# Patient Record
Sex: Male | Born: 1935
Health system: Southern US, Community
[De-identification: ages and names within clinical notes are randomized; demographics above are authoritative.]

## PROBLEM LIST (undated history)

## (undated) DIAGNOSIS — I340 Nonrheumatic mitral (valve) insufficiency: Secondary | ICD-10-CM

## (undated) DIAGNOSIS — F1010 Alcohol abuse, uncomplicated: Secondary | ICD-10-CM

## (undated) DIAGNOSIS — D332 Benign neoplasm of brain, unspecified: Secondary | ICD-10-CM

## (undated) DIAGNOSIS — C9110 Chronic lymphocytic leukemia of B-cell type not having achieved remission: Secondary | ICD-10-CM

## (undated) DIAGNOSIS — T50995A Adverse effect of other drugs, medicaments and biological substances, initial encounter: Secondary | ICD-10-CM

## (undated) DIAGNOSIS — Z950 Presence of cardiac pacemaker: Secondary | ICD-10-CM

## (undated) DIAGNOSIS — I5022 Chronic systolic (congestive) heart failure: Secondary | ICD-10-CM

## (undated) DIAGNOSIS — I2699 Other pulmonary embolism without acute cor pulmonale: Secondary | ICD-10-CM

## (undated) DIAGNOSIS — Z86718 Personal history of other venous thrombosis and embolism: Secondary | ICD-10-CM

## (undated) DIAGNOSIS — I4891 Unspecified atrial fibrillation: Secondary | ICD-10-CM

## (undated) DIAGNOSIS — I071 Rheumatic tricuspid insufficiency: Secondary | ICD-10-CM

## (undated) DIAGNOSIS — I4892 Unspecified atrial flutter: Secondary | ICD-10-CM

## (undated) DIAGNOSIS — I10 Essential (primary) hypertension: Secondary | ICD-10-CM

## (undated) HISTORY — DX: Rheumatic tricuspid insufficiency: I07.1

## (undated) HISTORY — DX: Unspecified atrial fibrillation: I48.92

## (undated) HISTORY — DX: Personal history of other venous thrombosis and embolism: Z86.718

## (undated) HISTORY — DX: Adverse effect of other drugs, medicaments and biological substances, initial encounter: T50.995A

## (undated) HISTORY — DX: Alcohol abuse, uncomplicated: F10.10

## (undated) HISTORY — DX: Unspecified atrial fibrillation: I48.91

## (undated) HISTORY — DX: Chronic systolic (congestive) heart failure: I50.22

## (undated) HISTORY — PX: APPENDECTOMY: SHX54

## (undated) HISTORY — PX: OTHER SURGICAL HISTORY: SHX169

## (undated) HISTORY — PX: HERNIA REPAIR: SHX51

## (undated) HISTORY — DX: Nonrheumatic mitral (valve) insufficiency: I34.0

---

## 2015-07-11 ENCOUNTER — Inpatient Hospital Stay (HOSPITAL_COMMUNITY)
Admission: EM | Admit: 2015-07-11 | Discharge: 2015-07-18 | DRG: 175 | Disposition: A | Discharge status: Home / Self Care | Payer: Medicare Other | Attending: Internal Medicine | Admitting: Internal Medicine

## 2015-07-11 ENCOUNTER — Emergency Department (HOSPITAL_COMMUNITY): Payer: Medicare Other

## 2015-07-11 ENCOUNTER — Encounter (HOSPITAL_COMMUNITY): Payer: Self-pay | Admitting: Emergency Medicine

## 2015-07-11 DIAGNOSIS — R55 Syncope and collapse: Secondary | ICD-10-CM | POA: Diagnosis present

## 2015-07-11 DIAGNOSIS — Z9849 Cataract extraction status, unspecified eye: Secondary | ICD-10-CM

## 2015-07-11 DIAGNOSIS — Z950 Presence of cardiac pacemaker: Secondary | ICD-10-CM

## 2015-07-11 DIAGNOSIS — I2699 Other pulmonary embolism without acute cor pulmonale: Secondary | ICD-10-CM | POA: Diagnosis not present

## 2015-07-11 DIAGNOSIS — Z6823 Body mass index (BMI) 23.0-23.9, adult: Secondary | ICD-10-CM

## 2015-07-11 DIAGNOSIS — R9431 Abnormal electrocardiogram [ECG] [EKG]: Secondary | ICD-10-CM

## 2015-07-11 DIAGNOSIS — R609 Edema, unspecified: Secondary | ICD-10-CM

## 2015-07-11 DIAGNOSIS — Z79899 Other long term (current) drug therapy: Secondary | ICD-10-CM

## 2015-07-11 DIAGNOSIS — I82413 Acute embolism and thrombosis of femoral vein, bilateral: Secondary | ICD-10-CM | POA: Diagnosis present

## 2015-07-11 DIAGNOSIS — Z86711 Personal history of pulmonary embolism: Secondary | ICD-10-CM

## 2015-07-11 DIAGNOSIS — Z0389 Encounter for observation for other suspected diseases and conditions ruled out: Secondary | ICD-10-CM

## 2015-07-11 DIAGNOSIS — M2500 Hemarthrosis, unspecified joint: Secondary | ICD-10-CM

## 2015-07-11 DIAGNOSIS — Z7982 Long term (current) use of aspirin: Secondary | ICD-10-CM

## 2015-07-11 DIAGNOSIS — Z7901 Long term (current) use of anticoagulants: Secondary | ICD-10-CM | POA: Diagnosis not present

## 2015-07-11 DIAGNOSIS — E440 Moderate protein-calorie malnutrition: Secondary | ICD-10-CM | POA: Diagnosis present

## 2015-07-11 DIAGNOSIS — I471 Supraventricular tachycardia: Secondary | ICD-10-CM

## 2015-07-11 DIAGNOSIS — Z888 Allergy status to other drugs, medicaments and biological substances status: Secondary | ICD-10-CM

## 2015-07-11 DIAGNOSIS — I5021 Acute systolic (congestive) heart failure: Secondary | ICD-10-CM | POA: Diagnosis present

## 2015-07-11 DIAGNOSIS — IMO0001 Reserved for inherently not codable concepts without codable children: Secondary | ICD-10-CM

## 2015-07-11 DIAGNOSIS — I481 Persistent atrial fibrillation: Secondary | ICD-10-CM | POA: Diagnosis present

## 2015-07-11 DIAGNOSIS — I429 Cardiomyopathy, unspecified: Secondary | ICD-10-CM | POA: Diagnosis present

## 2015-07-11 DIAGNOSIS — I4892 Unspecified atrial flutter: Secondary | ICD-10-CM | POA: Diagnosis present

## 2015-07-11 DIAGNOSIS — Z91041 Radiographic dye allergy status: Secondary | ICD-10-CM

## 2015-07-11 DIAGNOSIS — R0600 Dyspnea, unspecified: Secondary | ICD-10-CM | POA: Diagnosis not present

## 2015-07-11 DIAGNOSIS — M1711 Unilateral primary osteoarthritis, right knee: Secondary | ICD-10-CM | POA: Diagnosis present

## 2015-07-11 DIAGNOSIS — I951 Orthostatic hypotension: Secondary | ICD-10-CM | POA: Diagnosis present

## 2015-07-11 DIAGNOSIS — M25461 Effusion, right knee: Secondary | ICD-10-CM

## 2015-07-11 DIAGNOSIS — R42 Dizziness and giddiness: Secondary | ICD-10-CM | POA: Diagnosis present

## 2015-07-11 DIAGNOSIS — R51 Headache: Secondary | ICD-10-CM

## 2015-07-11 DIAGNOSIS — D329 Benign neoplasm of meninges, unspecified: Secondary | ICD-10-CM | POA: Diagnosis present

## 2015-07-11 DIAGNOSIS — C9110 Chronic lymphocytic leukemia of B-cell type not having achieved remission: Secondary | ICD-10-CM | POA: Diagnosis not present

## 2015-07-11 DIAGNOSIS — I5020 Unspecified systolic (congestive) heart failure: Secondary | ICD-10-CM | POA: Diagnosis present

## 2015-07-11 DIAGNOSIS — I10 Essential (primary) hypertension: Secondary | ICD-10-CM | POA: Diagnosis present

## 2015-07-11 DIAGNOSIS — R519 Headache, unspecified: Secondary | ICD-10-CM

## 2015-07-11 DIAGNOSIS — M109 Gout, unspecified: Secondary | ICD-10-CM | POA: Diagnosis present

## 2015-07-11 HISTORY — DX: Other pulmonary embolism without acute cor pulmonale: I26.99

## 2015-07-11 HISTORY — DX: Presence of cardiac pacemaker: Z95.0

## 2015-07-11 HISTORY — DX: Chronic lymphocytic leukemia of B-cell type not having achieved remission: C91.10

## 2015-07-11 HISTORY — DX: Essential (primary) hypertension: I10

## 2015-07-11 HISTORY — DX: Benign neoplasm of brain, unspecified: D33.2

## 2015-07-11 LAB — BASIC METABOLIC PANEL
ANION GAP: 12 (ref 5–15)
BUN: 13 mg/dL (ref 6–20)
CALCIUM: 10.3 mg/dL (ref 8.9–10.3)
CO2: 27 mmol/L (ref 22–32)
CREATININE: 0.87 mg/dL (ref 0.61–1.24)
Chloride: 101 mmol/L (ref 101–111)
GLUCOSE: 115 mg/dL — AB (ref 65–99)
Potassium: 3.6 mmol/L (ref 3.5–5.1)
Sodium: 140 mmol/L (ref 135–145)

## 2015-07-11 LAB — CBC WITH DIFFERENTIAL/PLATELET
BASOS ABS: 0 10*3/uL (ref 0.0–0.1)
BASOS PCT: 1 %
EOS ABS: 0.2 10*3/uL (ref 0.0–0.7)
EOS PCT: 2 %
HCT: 43.1 % (ref 39.0–52.0)
Hemoglobin: 14.1 g/dL (ref 13.0–17.0)
LYMPHS ABS: 4.6 10*3/uL — AB (ref 0.7–4.0)
Lymphocytes Relative: 55 %
MCH: 29.7 pg (ref 26.0–34.0)
MCHC: 32.7 g/dL (ref 30.0–36.0)
MCV: 90.9 fL (ref 78.0–100.0)
Monocytes Absolute: 0.5 10*3/uL (ref 0.1–1.0)
Monocytes Relative: 6 %
Neutro Abs: 3.1 10*3/uL (ref 1.7–7.7)
Neutrophils Relative %: 36 %
PLATELETS: 178 10*3/uL (ref 150–400)
RBC: 4.74 MIL/uL (ref 4.22–5.81)
RDW: 16.3 % — ABNORMAL HIGH (ref 11.5–15.5)
WBC: 8.4 10*3/uL (ref 4.0–10.5)

## 2015-07-11 LAB — I-STAT CHEM 8, ED
BUN: 19 mg/dL (ref 6–20)
CALCIUM ION: 1.13 mmol/L (ref 1.13–1.30)
CHLORIDE: 104 mmol/L (ref 101–111)
Creatinine, Ser: 0.8 mg/dL (ref 0.61–1.24)
GLUCOSE: 92 mg/dL (ref 65–99)
HCT: 48 % (ref 39.0–52.0)
Hemoglobin: 16.3 g/dL (ref 13.0–17.0)
Potassium: 5 mmol/L (ref 3.5–5.1)
Sodium: 138 mmol/L (ref 135–145)
TCO2: 24 mmol/L (ref 0–100)

## 2015-07-11 LAB — I-STAT TROPONIN, ED: Troponin i, poc: 0.01 ng/mL (ref 0.00–0.08)

## 2015-07-11 LAB — I-STAT CG4 LACTIC ACID, ED: LACTIC ACID, VENOUS: 2.12 mmol/L — AB (ref 0.5–2.0)

## 2015-07-11 LAB — CBG MONITORING, ED: GLUCOSE-CAPILLARY: 81 mg/dL (ref 65–99)

## 2015-07-11 LAB — TROPONIN I

## 2015-07-11 MED ORDER — PANTOPRAZOLE SODIUM 40 MG PO TBEC
40.0000 mg | DELAYED_RELEASE_TABLET | Freq: Every day | ORAL | Status: DC
  Administered 2015-07-11 – 2015-07-17 (×7): 40 mg via ORAL
  Filled 2015-07-11 (×7): qty 1

## 2015-07-11 MED ORDER — LORAZEPAM 2 MG/ML IJ SOLN: 1.0000 mg | Freq: Four times a day (QID) | INTRAMUSCULAR | Status: AC | PRN

## 2015-07-11 MED ORDER — THIAMINE HCL 100 MG/ML IJ SOLN
100.0000 mg | Freq: Every day | INTRAMUSCULAR | Status: DC
  Filled 2015-07-11: qty 2

## 2015-07-11 MED ORDER — VITAMIN B-1 100 MG PO TABS
100.0000 mg | ORAL_TABLET | Freq: Every day | ORAL | Status: DC
  Administered 2015-07-11 – 2015-07-18 (×8): 100 mg via ORAL
  Filled 2015-07-11 (×8): qty 1

## 2015-07-11 MED ORDER — APIXABAN 5 MG PO TABS
5.0000 mg | ORAL_TABLET | Freq: Two times a day (BID) | ORAL | Status: DC
  Administered 2015-07-11 – 2015-07-12 (×2): 5 mg via ORAL
  Filled 2015-07-11 (×2): qty 1

## 2015-07-11 MED ORDER — FOLIC ACID 1 MG PO TABS
1.0000 mg | ORAL_TABLET | Freq: Every day | ORAL | Status: DC
  Administered 2015-07-11 – 2015-07-18 (×8): 1 mg via ORAL
  Filled 2015-07-11 (×8): qty 1

## 2015-07-11 MED ORDER — OMEPRAZOLE MAGNESIUM 20 MG PO TBEC: 20.0000 mg | DELAYED_RELEASE_TABLET | Freq: Every day | ORAL | Status: DC

## 2015-07-11 MED ORDER — ASPIRIN EC 81 MG PO TBEC
81.0000 mg | DELAYED_RELEASE_TABLET | Freq: Every day | ORAL | Status: DC
  Administered 2015-07-11 – 2015-07-18 (×8): 81 mg via ORAL
  Filled 2015-07-11 (×8): qty 1

## 2015-07-11 MED ORDER — SODIUM CHLORIDE 0.9% FLUSH
3.0000 mL | Freq: Two times a day (BID) | INTRAVENOUS | Status: DC
  Administered 2015-07-11 – 2015-07-18 (×10): 3 mL via INTRAVENOUS

## 2015-07-11 MED ORDER — SODIUM CHLORIDE 0.9 % IV SOLN
INTRAVENOUS | Status: AC
  Administered 2015-07-11: 21:00:00 via INTRAVENOUS

## 2015-07-11 MED ORDER — SODIUM CHLORIDE 0.9 % IV BOLUS (SEPSIS)
500.0000 mL | Freq: Once | INTRAVENOUS | Status: AC
  Administered 2015-07-11: 500 mL via INTRAVENOUS

## 2015-07-11 MED ORDER — METOPROLOL TARTRATE 12.5 MG HALF TABLET
12.5000 mg | ORAL_TABLET | Freq: Two times a day (BID) | ORAL | Status: DC
  Administered 2015-07-11 – 2015-07-15 (×7): 12.5 mg via ORAL
  Filled 2015-07-11 (×8): qty 1

## 2015-07-11 MED ORDER — LORAZEPAM 1 MG PO TABS: 1.0000 mg | ORAL_TABLET | Freq: Four times a day (QID) | ORAL | Status: AC | PRN

## 2015-07-11 MED ORDER — GUAIFENESIN ER 600 MG PO TB12
600.0000 mg | ORAL_TABLET | Freq: Two times a day (BID) | ORAL | Status: DC
  Administered 2015-07-11 – 2015-07-18 (×14): 600 mg via ORAL
  Filled 2015-07-11 (×14): qty 1

## 2015-07-11 NOTE — H&P (Signed)
PCP: Pcp Not In System Lives in [place] ([place]  Referring provider Reather Converse   Chief Complaint:  syncope  HPI: Randall Lawrence is a 82 y.o. male   has a past medical history of History of blood clots; Pulmonary embolism ([place]); Brain tumor (benign) ([place]); Leukemia, chronic lymphocytic ([place]); Hypertension; and Pacemaker.   Presented with syncope. He woke up this morning and was feeling well made lunch for everyone but have not ate himself and started to feel lightheaded and collapsed. He was unresponsive for 3-4 min.  He has not been eating and drinking well for some time. He has been getting dizzy and light headed lately. He has been checking his blood pressure and ha e hd some low numbers lately. In AM before this happened his BP was 108/86.  Denies any chest pains he has dyspnea on exertion and have had for a while. Per notes on arrival of EMS patient was hypotensive blood sugar was 124 he felt lightheaded when sitting up IN ER: Glucose 115 lactic acid 2.1 to initial blood pressure 90/51 with administration of IV fluids improved 214/81 patient currently feels much better. Has been afebrile. CT scan of the head showing 2 cm meningioma she is known to the patient but otherwise nonacute. Chest x-ray no acute findings. EKG appeared to be abnormal with frequent PVC but no evidence of acute ischemia.   Regarding pertinent past history: Hx of bilateral DVT and PE diagnosed in November and has been on Eliquis since then, He has hx of Meningioma that has been stable. He has pacemaker unsure why. He has CLL but it has been stable for 20 years not any treatment. Patient has a cardiologist in [place] denies history of heart attacks or coronary artery disease.    Hospitalist was called for admission for Syncope  Review of Systems:    Pertinent positives include: fatigue, dyspnea on exertion, excess mucus, productive cough,  Constitutional:  No  weight loss, night sweats, Fevers, chills,  weight loss  HEENT:  No headaches, Difficulty swallowing,Tooth/dental problems,Sore throat,  No sneezing, itching, ear ache, nasal congestion, post nasal drip,  Cardio-vascular:  No chest pain, Orthopnea, PND, anasarca, dizziness, palpitations.no Bilateral lower extremity swelling  GI:  No heartburn, indigestion, abdominal pain, nausea, vomiting, diarrhea, change in bowel habits, loss of appetite, melena, blood in stool, hematemesis Resp:  no shortness of breath at rest.  No non-productive cough, No coughing up of blood.No change in color of mucus.No wheezing. Skin:  no rash or lesions. No jaundice GU:  no dysuria, change in color of urine, no urgency or frequency. No straining to urinate.  No flank pain.  Musculoskeletal:  No joint pain or no joint swelling. No decreased range of motion. No back pain.  Psych:  No change in mood or affect. No depression or anxiety. No memory loss.  Neuro: no localizing neurological complaints, no tingling, no weakness, no double vision, no gait abnormality, no slurred speech, no confusion  Otherwise ROS are negative except for above, 10 systems were reviewed  Past Medical History: Past Medical History  Diagnosis Date  . History of blood clots   . Pulmonary embolism ([place])   . Brain tumor (benign) ([place])   . Leukemia, chronic lymphocytic ([place])   . Hypertension   . Pacemaker    Past Surgical History  Procedure Laterality Date  . Cataracts    . Appendectomy    . Hernia repair  Medications: Prior to Admission medications   Medication Sig Start Date End Date Taking? Authorizing Provider  acetaminophen (TYLENOL) 500 MG tablet Take 1,000 mg by mouth every 6 (six) hours as needed (pain).   Yes Historical Provider, MD  apixaban (ELIQUIS) 5 MG TABS tablet Take 5 mg by mouth 2 (two) times daily.   Yes Historical Provider, MD  aspirin EC 81 MG tablet Take 81 mg by mouth daily.   Yes Historical Provider,  MD  furosemide (LASIX) 40 MG tablet Take 40 mg by mouth daily as needed (ankle swelling).  05/04/15  Yes Historical Provider, MD  metoprolol tartrate (LOPRESSOR) 25 MG tablet Take 12.5 mg by mouth 2 (two) times daily.   Yes Historical Provider, MD  omeprazole (PRILOSEC OTC) 20 MG tablet Take 20 mg by mouth daily.   Yes Historical Provider, MD  polyvinyl alcohol (LIQUIFILM TEARS) 1.4 % ophthalmic solution Place 1 drop into both eyes 2 (two) times daily as needed for dry eyes.   Yes Historical Provider, MD  Vitamin D, Ergocalciferol, (DRISDOL) 50000 units CAPS capsule Take 50,000 Units by mouth every 7 (seven) days.   Yes Historical Provider, MD    Allergies:   Allergies  Allergen Reactions  . Iodine Hives    Reaction to topical povidine iodine  . Ivp Dye [Iodinated Diagnostic Agents] Hives    Pt states [facility] in [facility]  . Lisinopril Hives  . Losartan Hives  . Zocor [Simvastatin] Hives    Social History:  Ambulatory   [place]  Lives at home With family     reports that he has never smoked. He does not have any smokeless tobacco history on file. He reports that he drinks alcohol. He reports that he does not use illicit drugs.     Family History: family history includes CAD in his mother; Peripheral vascular disease in his mother.    Physical Exam: Patient Vitals for the past 24 hrs:  BP Pulse Resp SpO2  07/11/15 1715 116/65 mmHg 81 24 98 %  07/11/15 1700 108/69 mmHg 98 16 100 %  07/11/15 1645 101/61 mmHg 81 18 99 %  07/11/15 1630 (!) 98/51 mmHg 83 18 97 %  07/11/15 1615 105/67 mmHg - 18 -  07/11/15 1600 (!) 102/51 mmHg 85 16 100 %  07/11/15 1545 113/81 mmHg 79 20 98 %  07/11/15 1530 116/76 mmHg - 18 -  07/11/15 1515 105/61 mmHg - 18 -  07/11/15 1502 114/77 mmHg 99 22 100 %    1. General:  in No Acute distress 2. Psychological: Alert and   Oriented 3. Head/ENT:     Dry Mucous Membranes                          Head Non traumatic, neck supple                           Normal  Dentition 4. SKIN:  decreased Skin turgor,  Skin clean Dry and intact no rash 5. Heart: Regular rate and rhythm mild systolic Murmur, Rub or gallop 6. Lungs:   no wheezes or crackles   7. Abdomen: Soft, non-tender, Non distended 8. Lower extremities: no clubbing, cyanosis, or edema 9. Neurologically Grossly intact, moving all 4 extremities equally 10. MSK: Normal range of motion  body mass index is unknown because there is no height or weight on file.   Labs on  Admission:   Results for orders placed or performed during the hospital encounter of 07/11/15 (from the past 24 hour(s))  CBG monitoring, ED     Status: None   Collection Time: 07/11/15  3:09 PM  Result Value Ref Range   Glucose-Capillary 81 65 - 99 mg/dL   Comment 1 Notify RN    Comment 2 Document in Chart   Basic metabolic panel     Status: Abnormal   Collection Time: 07/11/15  3:50 PM  Result Value Ref Range   Sodium 140 135 - 145 mmol/L   Potassium 3.6 3.5 - 5.1 mmol/L   Chloride 101 101 - 111 mmol/L   CO2 27 22 - 32 mmol/L   Glucose, Bld 115 (H) 65 - 99 mg/dL   BUN 13 6 - 20 mg/dL   Creatinine, Ser 0.87 0.61 - 1.24 mg/dL   Calcium 10.3 8.9 - 10.3 mg/dL   GFR calc non Af Amer >60 >60 mL/min   GFR calc Af Amer >60 >60 mL/min   Anion gap 12 5 - 15  CBC with Differential/Platelet     Status: Abnormal   Collection Time: 07/11/15  3:50 PM  Result Value Ref Range   WBC 8.4 4.0 - 10.5 K/uL   RBC 4.74 4.22 - 5.81 MIL/uL   Hemoglobin 14.1 13.0 - 17.0 g/dL   HCT 43.1 39.0 - 52.0 %   MCV 90.9 78.0 - 100.0 fL   MCH 29.7 26.0 - 34.0 pg   MCHC 32.7 30.0 - 36.0 g/dL   RDW 16.3 (H) 11.5 - 15.5 %   Platelets 178 150 - 400 K/uL   Neutrophils Relative % 36 %   Neutro Abs 3.1 1.7 - 7.7 K/uL   Lymphocytes Relative 55 %   Lymphs Abs 4.6 (H) 0.7 - 4.0 K/uL   Monocytes Relative 6 %   Monocytes Absolute 0.5 0.1 - 1.0 K/uL   Eosinophils Relative 2 %   Eosinophils Absolute 0.2 0.0 - 0.7 K/uL    Basophils Relative 1 %   Basophils Absolute 0.0 0.0 - 0.1 K/uL  I-stat troponin, ED     Status: None   Collection Time: 07/11/15  4:06 PM  Result Value Ref Range   Troponin i, poc 0.01 0.00 - 0.08 ng/mL   Comment 3          I-Stat CG4 Lactic Acid, ED     Status: Abnormal   Collection Time: 07/11/15  4:11 PM  Result Value Ref Range   Lactic Acid, Venous 2.12 (HH) 0.5 - 2.0 mmol/L   Comment NOTIFIED PHYSICIAN   I-Stat Chem 8, ED     Status: None   Collection Time: 07/11/15  4:31 PM  Result Value Ref Range   Sodium 138 135 - 145 mmol/L   Potassium 5.0 3.5 - 5.1 mmol/L   Chloride 104 101 - 111 mmol/L   BUN 19 6 - 20 mg/dL   Creatinine, Ser 0.80 0.61 - 1.24 mg/dL   Glucose, Bld 92 65 - 99 mg/dL   Calcium, Ion 1.13 1.13 - 1.30 mmol/L   TCO2 24 0 - 100 mmol/L   Hemoglobin 16.3 13.0 - 17.0 g/dL   HCT 48.0 39.0 - 52.0 %    UA ordered  No results found for: HGBA1C  CrCl cannot be calculated (Unknown ideal weight.).  BNP (last 3 results) No results for input(s): PROBNP in the last 8760 hours.  Other results:  I have pearsonaly reviewed this: ECG REPORT  Rate: 83  Rhythm: atrial tachycardia, frequent PVC, on tele appeares paced now.  ST&T Change: no evidence of acute ischemia [id number]  There were no vitals filed for this visit.   Cultures: No results found for: SDES, SPECREQUEST, CULT, REPTSTATUS   Radiological Exams on Admission: Ct Head Wo Contrast  07/11/2015  CLINICAL DATA:  Shortness breath and headache. Dizziness and syncope today. No trauma. EXAM: CT HEAD WITHOUT CONTRAST TECHNIQUE: Contiguous axial images were obtained from the base of the skull through the vertex without intravenous contrast. COMPARISON:  None. FINDINGS: Moderate periventricular and diffuse subcortical white matter hypoattenuation is present bilaterally. The basal ganglia are intact. The insular ribbon is normal bilaterally. No focal cortical defects are present. Atherosclerotic calcifications are  present in the cavernous internal carotid arteries bilaterally is well is the dural margin of the vertebral arteries. 82 cm hyperdense dural-based mass is associated with the superior sagittal sinus on the left. This is most compatible with a meningioma. No other focal mass lesions are present. Ventricles are proportionate to the degree of atrophy. No significant extra-axial fluid collection is present. IMPRESSION: 1. No acute intracranial abnormality. 2. Moderate periventricular and subcortical white matter disease bilaterally. This likely reflects the sequela of chronic microvascular ischemia. 3. 2 cm hyperdense extra-axial dural-based mass lesion near the vertex compatible with a meningioma. Electronically Signed   By: San Morelle M.D.   On: 07/11/2015 18:05   Dg Chest Portable 1 View  07/11/2015  CLINICAL DATA:  Dizziness with headaches and severely shortness of breath off and on for several weeks. EXAM: PORTABLE CHEST 1 VIEW COMPARISON:  None. FINDINGS: 1556 hours. Lordotic patient positioning. The lungs are clear wiithout focal pneumonia, edema, pneumothorax or pleural effusion. Cardiopericardial silhouette is at upper limits of normal for size. The visualized bony structures of the thorax are intact. Left delete permanent pacemaker noted. Telemetry leads overlie the chest. IMPRESSION: No active disease. Electronically Signed   By: Misty Stanley M.D.   On: 07/11/2015 16:12    Chart has been reviewed  Family   at  Bedside  plan of care was discussed with  Wife Lilbern Slacum [phone]  Assessment/Plan  82 yo M with known history of CLL, status post pacemaker, history of bilateral DVT and PE in November on chronic anticoagulation presents with syncopal event in a setting of poor by mouth intake  Present on Admission:  . Syncope and collapse  most likely secondary to orthostatic hypotension and poor by mouth intake will admit to telemetry, cycle cardiac enzymes, obtain echogram,   carotid Dopplers, will need to obtain records from cardiology office regarding past history  . Hypertension - continue metoprolol with holding parameters  . CLL (chronic lymphocytic leukemia) (HCC) stable chronic white blood cell, per to be normal  . Dyspnea this was ever since history of PE. Patient. States sometimes he has skipped his anticoagulation but has been taken it regularly lately. Has not have any lower extremity swelling currently given syncope will obtain VQ scan patient has allergies to his CT contrast.     Prophylaxis: eliquis  CODE STATUS:  FULL CODE  as per patient    Disposition:   To home once workup is complete and patient is stable  Other plan as per orders.  I have spent a total of 57 min on this admission   Trinaty Bundrick 07/11/2015, 7:31 PM    [facility]  Pager [phone]   after 2 AM please page floor coverage PA If 7AM-7PM, please contact the day team  taking care of the patient  [email or web address]  Password [email or web address]

## 2015-07-11 NOTE — ED Provider Notes (Signed)
CSN: [id number]     Arrival date & time 07/11/15  1456 History   First MD Initiated Contact with Patient 07/11/15 1459     Chief Complaint  Patient presents with  . Loss of Consciousness  . Shortness of Breath     (Consider location/radiation/quality/duration/timing/severity/associated sxs/prior Treatment) HPI Comments: 82 yo male with pacemaker, htn, PE on eliquis, dvt bilateral LE, brain tumor no treatment, CML presents with syncope, ha and sob.  Happened PTA.  Pt felt generalized HA/ dizziness then syncope for 3-4 minutes, unresponsive.  No CPR.  Pt has passed out in the past. Pt visiting from [facility].  No bleeding recently however hx of GI bleeding.  No CVA hx.  Non smoker.  No heart failure hx.  SOB since PE.    Patient is a 82 y.o. male presenting with syncope and shortness of breath. The history is provided by the patient and a relative.  Loss of Consciousness Associated symptoms: dizziness and shortness of breath   Associated symptoms: no chest pain, no fever and no vomiting   Shortness of Breath Associated symptoms: syncope   Associated symptoms: no chest pain, no cough, no fever and no vomiting     Past Medical History  Diagnosis Date  . History of blood clots   . Pulmonary embolism ([place])   . Brain tumor (benign) ([place])   . Leukemia, chronic lymphocytic ([place])   . Hypertension   . Pacemaker    Past Surgical History  Procedure Laterality Date  . Cataracts    . Appendectomy    . Hernia repair     No family history on file. Social History  Substance Use Topics  . Smoking status: Never Smoker   . Smokeless tobacco: None  . Alcohol Use: Yes     Comment: 1 pint bourbon daily (plus )     Review of Systems  Constitutional: Positive for fatigue. Negative for fever and chills.  HENT: Negative for congestion.   Eyes: Negative for visual disturbance.  Respiratory: Positive for shortness of breath. Negative for cough.   Cardiovascular: Positive for leg swelling and syncope.  Negative for chest pain.  Gastrointestinal: Negative for vomiting.  Genitourinary: Negative for dysuria.  Musculoskeletal: Negative for gait problem.  Skin: Negative for wound.  Neurological: Positive for dizziness and light-headedness.      Allergies  Iodine; Ivp dye; Lisinopril; Losartan; and Zocor  Home Medications   Prior to Admission medications   Medication Sig Start Date End Date Taking? Authorizing Provider  acetaminophen (TYLENOL) 500 MG tablet Take 1,000 mg by mouth every 6 (six) hours as needed (pain).   Yes Historical Provider, MD  apixaban (ELIQUIS) 5 MG TABS tablet Take 5 mg by mouth 2 (two) times daily.   Yes Historical Provider, MD  aspirin EC 81 MG tablet Take 81 mg by mouth daily.   Yes Historical Provider, MD  furosemide (LASIX) 40 MG tablet Take 40 mg by mouth daily as needed (ankle swelling).  05/04/15  Yes Historical Provider, MD  metoprolol tartrate (LOPRESSOR) 25 MG tablet Take 12.5 mg by mouth 2 (two) times daily.   Yes Historical Provider, MD  omeprazole (PRILOSEC OTC) 20 MG tablet Take 20 mg by mouth daily.   Yes Historical Provider, MD  polyvinyl alcohol (LIQUIFILM TEARS) 1.4 % ophthalmic solution Place 1 drop into both eyes 2 (two) times daily as needed for dry eyes.   Yes Historical Provider, MD  Vitamin D, Ergocalciferol, (DRISDOL) 50000 units CAPS capsule Take 50,000 Units by  mouth every 7 (seven) days.   Yes Historical Provider, MD   BP 116/65 mmHg  Pulse 81  Resp 24  SpO2 98% Physical Exam  Constitutional: He is oriented to person, place, and time. He appears well-developed.  HENT:  Head: Normocephalic and atraumatic.  Dry mm  Eyes: Right eye exhibits no discharge. Left eye exhibits no discharge.  Neck: Normal range of motion. Neck supple. No tracheal deviation present.  Cardiovascular: Regular rhythm.  Tachycardia present.   Pulmonary/Chest: Effort normal and breath sounds normal.  Abdominal: Soft. He exhibits no distension. There is no  tenderness. There is no guarding.  Musculoskeletal: He exhibits edema (mild bilateral LE). He exhibits no tenderness.  Neurological: He is alert and oriented to person, place, and time.  General weakness, no arm or leg drift, finger nose, EOMFI.  Sensation intact to palpation bilateral.    Skin: Skin is warm. No rash noted.  Psychiatric: He has a normal mood and affect.  Nursing note and vitals reviewed.   ED Course  Procedures (including critical care time) Labs Review Labs Reviewed  BASIC METABOLIC PANEL - Abnormal; Notable for the following:    Glucose, Bld 115 (*)    All other components within normal limits  CBC WITH DIFFERENTIAL/PLATELET - Abnormal; Notable for the following:    RDW 16.3 (*)    Lymphs Abs 4.6 (*)    All other components within normal limits  I-STAT CG4 LACTIC ACID, ED - Abnormal; Notable for the following:    Lactic Acid, Venous 2.12 (*)    All other components within normal limits  URINALYSIS, ROUTINE W REFLEX MICROSCOPIC (NOT AT [facility])  CBG MONITORING, ED  I-STAT TROPOININ, ED  I-STAT CHEM 8, ED    Imaging Review Ct Head Wo Contrast  07/11/2015  CLINICAL DATA:  Shortness breath and headache. Dizziness and syncope today. No trauma. EXAM: CT HEAD WITHOUT CONTRAST TECHNIQUE: Contiguous axial images were obtained from the base of the skull through the vertex without intravenous contrast. COMPARISON:  None. FINDINGS: Moderate periventricular and diffuse subcortical white matter hypoattenuation is present bilaterally. The basal ganglia are intact. The insular ribbon is normal bilaterally. No focal cortical defects are present. Atherosclerotic calcifications are present in the cavernous internal carotid arteries bilaterally is well is the dural margin of the vertebral arteries. 82 cm hyperdense dural-based mass is associated with the superior sagittal sinus on the left. This is most compatible with a meningioma. No other focal mass lesions are present. Ventricles are  proportionate to the degree of atrophy. No significant extra-axial fluid collection is present. IMPRESSION: 1. No acute intracranial abnormality. 2. Moderate periventricular and subcortical white matter disease bilaterally. This likely reflects the sequela of chronic microvascular ischemia. 3. 2 cm hyperdense extra-axial dural-based mass lesion near the vertex compatible with a meningioma. Electronically Signed   By: San Morelle M.D.   On: 07/11/2015 18:05   Dg Chest Portable 1 View  07/11/2015  CLINICAL DATA:  Dizziness with headaches and severely shortness of breath off and on for several weeks. EXAM: PORTABLE CHEST 1 VIEW COMPARISON:  None. FINDINGS: 1556 hours. Lordotic patient positioning. The lungs are clear wiithout focal pneumonia, edema, pneumothorax or pleural effusion. Cardiopericardial silhouette is at upper limits of normal for size. The visualized bony structures of the thorax are intact. Left delete permanent pacemaker noted. Telemetry leads overlie the chest. IMPRESSION: No active disease. Electronically Signed   By: Misty Stanley M.D.   On: 07/11/2015 16:12   I have personally reviewed  and evaluated these images and lab results as part of my medical decision-making.   EKG Interpretation   Date/Time:  Sunday July 11 2015 14:58:02 EST Ventricular Rate:  83 PR Interval:  272 QRS Duration: 142 QT Interval:  407 QTC Calculation: 478 R Axis:   -82 Text Interpretation:  Sinus or ectopic atrial tachycardia Ventricular  trigeminy Prolonged PR interval Nonspecific IVCD with LAD Left ventricular  hypertrophy Anterior infarct, old Lateral leads are also involved Probable  RV involvement, suggest recording right precordial leads Baseline wander  in lead(s) V6 No old Confirmed by Zanaria Morell  MD, Millena Callins ([id number]) on 07/11/2015  3:11:29 PM      MDM   Final diagnoses:  Syncope and collapse  Dyspnea  EKG abnormalities   Elderly patient presents with syncope, broad differential  including CNS bleed, PE, dehydration, arrhythmia, bleeding.  Pt has history support for PE, bleeding and dehydration.  Plan for blood work, cardiac screen, CT angio if renal function okay.  EKG abnormal however no olds.  Pacemaker being interrogated. Plan for tele observation.   CT head to look for bleeding, fluids.   CT head negative. Vital signs stable. Unable to obtain CT angina the chest due to allergy. Plan for VQ scan inpatient and telemetry observation. Paged hospitalist. The patients results and plan were reviewed and discussed.   Any x-rays performed were independently reviewed by myself.   Differential diagnosis were considered with the presenting HPI.  Medications  0.9 %  sodium chloride infusion (not administered)  sodium chloride 0.9 % bolus 500 mL (500 mLs Intravenous New Bag/Given 07/11/15 1630)    Filed Vitals:   07/11/15 1630 07/11/15 1645 07/11/15 1700 07/11/15 1715  BP: 98/51 101/61 108/69 116/65  Pulse: 83 81 98 81  Resp: 18 18 16 24   SpO2: 97% 99% 100% 98%    Final diagnoses:  Syncope and collapse  Dyspnea  EKG abnormalities    Admission/ observation were discussed with the admitting physician, patient and/or family and they are comfortable with the plan.     Elnora Morrison, MD 07/11/15 [phone]

## 2015-07-11 NOTE — ED Notes (Addendum)
Report given to Ray, RN.  The floor pt is going to has an emergent patient issue.  Ray will call when ready for pt.

## 2015-07-11 NOTE — ED Notes (Signed)
Patient transported to CT 

## 2015-07-11 NOTE — ED Notes (Addendum)
ATTEMPTED REPORT  

## 2015-07-11 NOTE — ED Notes (Signed)
Pt eating dinner

## 2015-07-11 NOTE — ED Notes (Signed)
Dr. Reather Converse notified that interrogation was already completed and they are sending the report.

## 2015-07-11 NOTE — ED Notes (Addendum)
Syncopal episode while eating, loss of consciousness, daughter helped him to the floor-- on EMS arrival-- pt was hypotensive. On arrival to ED pt is 114/77. Alert/oriented x4. CBG at house was 124 . Pt has a headache, feels dizzy when sitting up.

## 2015-07-12 ENCOUNTER — Observation Stay (HOSPITAL_BASED_OUTPATIENT_CLINIC_OR_DEPARTMENT_OTHER): Payer: Medicare Other

## 2015-07-12 ENCOUNTER — Observation Stay (HOSPITAL_COMMUNITY): Payer: Medicare Other

## 2015-07-12 DIAGNOSIS — R55 Syncope and collapse: Secondary | ICD-10-CM

## 2015-07-12 DIAGNOSIS — C9110 Chronic lymphocytic leukemia of B-cell type not having achieved remission: Secondary | ICD-10-CM | POA: Diagnosis not present

## 2015-07-12 DIAGNOSIS — Z7901 Long term (current) use of anticoagulants: Secondary | ICD-10-CM | POA: Diagnosis not present

## 2015-07-12 DIAGNOSIS — I2699 Other pulmonary embolism without acute cor pulmonale: Secondary | ICD-10-CM | POA: Diagnosis not present

## 2015-07-12 DIAGNOSIS — E440 Moderate protein-calorie malnutrition: Secondary | ICD-10-CM | POA: Diagnosis present

## 2015-07-12 LAB — COMPREHENSIVE METABOLIC PANEL
ALK PHOS: 88 U/L (ref 38–126)
AST: 16 U/L (ref 15–41)
Albumin: 3.5 g/dL (ref 3.5–5.0)
Anion gap: 8 (ref 5–15)
BILIRUBIN TOTAL: 0.6 mg/dL (ref 0.3–1.2)
BUN: 11 mg/dL (ref 6–20)
CALCIUM: 9.6 mg/dL (ref 8.9–10.3)
CHLORIDE: 107 mmol/L (ref 101–111)
CO2: 29 mmol/L (ref 22–32)
CREATININE: 0.88 mg/dL (ref 0.61–1.24)
Glucose, Bld: 119 mg/dL — ABNORMAL HIGH (ref 65–99)
Potassium: 3.7 mmol/L (ref 3.5–5.1)
Sodium: 144 mmol/L (ref 135–145)
TOTAL PROTEIN: 6.1 g/dL — AB (ref 6.5–8.1)

## 2015-07-12 LAB — CBC WITH DIFFERENTIAL/PLATELET
BASOS PCT: 0 %
Basophils Absolute: 0 10*3/uL (ref 0.0–0.1)
EOS ABS: 0.2 10*3/uL (ref 0.0–0.7)
EOS PCT: 2 %
HCT: 41.5 % (ref 39.0–52.0)
Hemoglobin: 13.6 g/dL (ref 13.0–17.0)
LYMPHS ABS: 5.6 10*3/uL — AB (ref 0.7–4.0)
Lymphocytes Relative: 62 %
MCH: 30 pg (ref 26.0–34.0)
MCHC: 32.8 g/dL (ref 30.0–36.0)
MCV: 91.4 fL (ref 78.0–100.0)
Monocytes Absolute: 0.6 10*3/uL (ref 0.1–1.0)
Monocytes Relative: 6 %
NEUTROS PCT: 30 %
Neutro Abs: 2.7 10*3/uL (ref 1.7–7.7)
PLATELETS: 180 10*3/uL (ref 150–400)
RBC: 4.54 MIL/uL (ref 4.22–5.81)
RDW: 16.6 % — ABNORMAL HIGH (ref 11.5–15.5)
WBC: 9.1 10*3/uL (ref 4.0–10.5)

## 2015-07-12 LAB — APTT: aPTT: 32 seconds (ref 24–37)

## 2015-07-12 LAB — TROPONIN I: Troponin I: <0.03 ng/mL (ref <0.031)

## 2015-07-12 LAB — HEPARIN LEVEL (UNFRACTIONATED): Heparin Unfractionated: >2.2 IU/mL — ABNORMAL HIGH (ref 0.30–0.70)

## 2015-07-12 LAB — MAGNESIUM: MAGNESIUM: 1.8 mg/dL (ref 1.7–2.4)

## 2015-07-12 LAB — PHOSPHORUS: Phosphorus: 3.4 mg/dL (ref 2.5–4.6)

## 2015-07-12 MED ORDER — TECHNETIUM TC 99M DIETHYLENETRIAME-PENTAACETIC ACID: 31.1000 | Freq: Once | INTRAVENOUS | Status: DC | PRN

## 2015-07-12 MED ORDER — TECHNETIUM TO 99M ALBUMIN AGGREGATED
4.2000 | Freq: Once | INTRAVENOUS | Status: AC | PRN
  Administered 2015-07-12: 4 via INTRAVENOUS

## 2015-07-12 MED ORDER — BOOST PLUS PO LIQD
237.0000 mL | Freq: Three times a day (TID) | ORAL | Status: DC
  Administered 2015-07-12 – 2015-07-18 (×16): 237 mL via ORAL
  Filled 2015-07-12 (×23): qty 237

## 2015-07-12 MED ORDER — HEPARIN (PORCINE) IN NACL 100-0.45 UNIT/ML-% IJ SOLN
1100.0000 [IU]/h | INTRAMUSCULAR | Status: DC
  Administered 2015-07-12: 1100 [IU]/h via INTRAVENOUS
  Filled 2015-07-12: qty 250

## 2015-07-12 MED ORDER — ACETAMINOPHEN 325 MG PO TABS
650.0000 mg | ORAL_TABLET | ORAL | Status: DC | PRN
  Administered 2015-07-12 – 2015-07-15 (×4): 650 mg via ORAL
  Filled 2015-07-12 (×4): qty 2

## 2015-07-12 NOTE — Progress Notes (Signed)
  Echocardiogram 2D Echocardiogram has been performed.  Jennette Dubin 07/12/2015, 9:43 AM

## 2015-07-12 NOTE — Progress Notes (Signed)
ANTICOAGULATION CONSULT NOTE - Initial Consult  Pharmacy Consult for heparin Indication: pulmonary embolus  Allergies  Allergen Reactions  . Iodine Hives    Reaction to topical povidine iodine  . Ivp Dye [Iodinated Diagnostic Agents] Hives    Pt states that [facility] done pre-treatment in the past  . Lisinopril Hives  . Losartan Hives  . Zocor [Simvastatin] Hives    Patient Measurements: Height: 5\' 9"  (175.3 cm) Weight: 153 lb 3.2 oz (69.491 kg) (a scale) IBW/kg (Calculated) : 70.7 Heparin Dosing Weight: 69.5 kg  Vital Signs: Temp: 97.9 F (36.6 C) (01/30 0744) Temp Source: Oral (01/30 0744) BP: 133/98 mmHg (01/30 0957) Pulse Rate: 84 (01/30 0957)  Labs:  Recent Labs  07/11/15 1550 07/11/15 1631 07/11/15 2123 07/12/15 0327 07/12/15 1159  HGB 14.1 16.3  --  13.6  --   HCT 43.1 48.0  --  41.5  --   PLT 178  --   --  180  --   CREATININE 0.87 0.80  --  0.88  --   TROPONINI  --   --  <0.03 <0.03 <0.03    Estimated Creatinine Clearance: 57 mL/min (by C-G formula based on Cr of 0.88).   Medical History: Past Medical History  Diagnosis Date  . History of blood clots   . Pulmonary embolism ([place])   . Brain tumor (benign) ([place])   . Leukemia, chronic lymphocytic ([place])   . Hypertension   . Pacemaker    Assessment: 80 YOM with hx of PE, on eliquis 5mg  BID PTA, who presented after a syncopal episode yesterday, VQ scan is high probability for PE in RUL and RLL. Pharmacy is consulted to switch eliquis to IV heparin. eliqusi last dose was this AM at ~ 1000. Hgb 13.6, plt 180k, est. crcl ~ 55-60 ml/min. Head CT - negative for bleeding.  Goal of Therapy:  APTT = 66-102 Heparin level 0.3-0.7 units/ml Monitor platelets by anticoagulation protocol: Yes   Plan:  - Start heparin infusion 1100 units/hr at 2200 with no bolus - Baseline anti-Xa leval and aPTT level STAT - f/u aPTT at 0600 - daily aPTT/anti-Xa and CBC - f/u plans for oral anticoagulation.   Maryanna Shape,  PharmD, BCPS  Clinical Pharmacist  Pager: [phone]   07/12/2015,3:44 PM

## 2015-07-12 NOTE — Care Management Obs Status (Signed)
[place] NOTIFICATION   Patient Details  Name: Randall Lawrence MRN: [id number] Date of Birth: March 19, 1925   Medicare Observation Status Notification Given:   yes    Royston Bake, RN 07/12/2015, 3:36 PM

## 2015-07-12 NOTE — Progress Notes (Signed)
[facility] Progress Note   Randall Lawrence  [id number]  DOB: Jun 16, 1924  DOA: 07/11/2015 PCP: Pcp Not In System  Brief narrative: Randall Lawrence is a 82 y.o. male with h/o PE, HTN CLL is in [place] visiting family and presents after a syncopal episode yesterday. He admits to dyspnea and dizziness at home. EMS found him to be hypotensive with BP of 90/51.    Subjective: Short of breath on exertion and at times at rest. No cough.   Assessment/Plan: Principal Problem:   Syncope and collapse/  Acute pulmonary embolism - no c/o dyspnea and not hypoxic but  VQ is high probability for PE in RUL and RLL - will switch Apixaban to Heparin - possible hypercoaguable state due to CLL - f/u on ECHO, troponin negative  - heat CT reveals a meningioma and chronic microvascular disease  Active Problems:   Hypertension - cont metoprolol     CLL (chronic lymphocytic leukemia)     Malnutrition of moderate degree - will start supplements    Antibiotics: Anti-infectives    None     Code Status:     Code Status Orders        Start     Ordered   07/11/15 2054  Full code   Continuous     07/11/15 2053    Code Status History    Date Active Date Inactive Code Status Order ID Comments User Context   This patient has a current code status but no historical code status.     Family Communication:  Disposition Plan: home in 1-2 days DVT prophylaxis: [place] Consultants: hematology Procedures: VQ, ECHO    Objective: Filed Weights   07/11/15 2106 07/11/15 2158 07/12/15 0502  Weight: 69.355 kg (152 lb 14.4 oz) 69.446 kg (153 lb 1.6 oz) 69.491 kg (153 lb 3.2 oz)    Intake/Output Summary (Last 24 hours) at 07/12/15 1544 Last data filed at 07/12/15 0845  Gross per 24 hour  Intake    790 ml  Output    625 ml  Net    165 ml     Vitals Filed Vitals:   07/11/15 2158 07/12/15 0502 07/12/15 0744 07/12/15 0957  BP:  148/71 132/69 133/98  Pulse:  80 82 84  Temp:  98 F  (36.7 C) 97.9 F (36.6 C)   TempSrc:  Oral Oral   Resp:  18 18   Height:      Weight: 69.446 kg (153 lb 1.6 oz) 69.491 kg (153 lb 3.2 oz)    SpO2:  100% 100%     Exam:  General:  Pt is alert, not in acute distress  HEENT: No icterus, No thrush, oral mucosa moist  Cardiovascular: regular rate and rhythm, S1/S2 No murmur  Respiratory: clear to auscultation bilaterally   Abdomen: Soft, +Bowel sounds, non tender, non distended, no guarding  MSK: No cyanosis or clubbing- no pedal edema   Data Reviewed: Basic Metabolic Panel:  Recent Labs Lab 07/11/15 1550 07/11/15 1631 07/12/15 0327  NA 140 138 144  K 3.6 5.0 3.7  CL 101 104 107  CO2 27  --  29  GLUCOSE 115* 92 119*  BUN 13 19 11   CREATININE 0.87 0.80 0.88  CALCIUM 10.3  --  9.6  MG  --   --  1.8  PHOS  --   --  3.4   Liver Function Tests:  Recent Labs Lab 07/12/15 0327  AST 16  ALT <5*  ALKPHOS 88  BILITOT 0.6  PROT 6.1*  ALBUMIN 3.5   No results for input(s): LIPASE, AMYLASE in the last 168 hours. No results for input(s): AMMONIA in the last 168 hours. CBC:  Recent Labs Lab 07/11/15 1550 07/11/15 1631 07/12/15 0327  WBC 8.4  --  9.1  NEUTROABS 3.1  --  2.7  HGB 14.1 16.3 13.6  HCT 43.1 48.0 41.5  MCV 90.9  --  91.4  PLT 178  --  180   Cardiac Enzymes:  Recent Labs Lab 07/11/15 2123 07/12/15 0327 07/12/15 1159  TROPONINI <0.03 <0.03 <0.03   BNP (last 3 results) No results for input(s): BNP in the last 8760 hours.  ProBNP (last 3 results) No results for input(s): PROBNP in the last 8760 hours.  CBG:  Recent Labs Lab 07/11/15 1509  GLUCAP 81    No results found for this or any previous visit (from the past 240 hour(s)).   Studies: Ct Head Wo Contrast  07/11/2015  CLINICAL DATA:  Shortness breath and headache. Dizziness and syncope today. No trauma. EXAM: CT HEAD WITHOUT CONTRAST TECHNIQUE: Contiguous axial images were obtained from the base of the skull through the vertex  without intravenous contrast. COMPARISON:  None. FINDINGS: Moderate periventricular and diffuse subcortical white matter hypoattenuation is present bilaterally. The basal ganglia are intact. The insular ribbon is normal bilaterally. No focal cortical defects are present. Atherosclerotic calcifications are present in the cavernous internal carotid arteries bilaterally is well is the dural margin of the vertebral arteries. 82 cm hyperdense dural-based mass is associated with the superior sagittal sinus on the left. This is most compatible with a meningioma. No other focal mass lesions are present. Ventricles are proportionate to the degree of atrophy. No significant extra-axial fluid collection is present. IMPRESSION: 1. No acute intracranial abnormality. 2. Moderate periventricular and subcortical white matter disease bilaterally. This likely reflects the sequela of chronic microvascular ischemia. 3. 2 cm hyperdense extra-axial dural-based mass lesion near the vertex compatible with a meningioma. Electronically Signed   By: San Morelle M.D.   On: 07/11/2015 18:05   Nm Pulmonary Perf And Vent  07/12/2015  CLINICAL DATA:  Syncope and collapse, shortness of breath, history brain tumor, pulmonary embolism, chronic lymphocytic leukemia, hypertension EXAM: NUCLEAR MEDICINE VENTILATION - PERFUSION LUNG SCAN TECHNIQUE: Ventilation images were obtained in multiple projections using inhaled aerosol Tc-92m DTPA. Perfusion images were obtained in multiple projections after intravenous injection of Tc-56m MAA. RADIOPHARMACEUTICALS:  [id number] millicuries [id number] DTPA aerosol inhalation and 4.2 millicuries [id number] MAA IV COMPARISON:  None Radiographic correlation:  Chest radiograph 07/11/2015 FINDINGS: Ventilation: Central airway deposition of tracer. No focal ventilatory defects. Photopenic defect from pacemaker generator LEFT chest. Perfusion: Absent perfusion in RIGHT upper lobe. Subsegmental perfusion  defect RIGHT lower lobe, potentially an additional small subsegmental perfusion defect in RIGHT lower lobe. These areas ventilate normally. Normal perfusion to LEFT lung. Chest radiograph: Enlargement of cardiac silhouette post pacemaker. No gross infiltrates. IMPRESSION: Absent perfusion RIGHT upper lobe with additional subsegmental perfusion defect RIGHT lower lobe with normal ventilation in these areas. Findings represent a high probability for pulmonary embolism. Findings called to Janett Billow RN on [place] on 07/12/2015 at 1431 hours. Electronically Signed   By: Lavonia Dana M.D.   On: 07/12/2015 14:31   Dg Chest Portable 1 View  07/11/2015  CLINICAL DATA:  Dizziness with headaches and severely shortness of breath off and on for several weeks. EXAM: PORTABLE CHEST 1 VIEW COMPARISON:  None. FINDINGS: 1556 hours. Lordotic patient positioning. The  lungs are clear wiithout focal pneumonia, edema, pneumothorax or pleural effusion. Cardiopericardial silhouette is at upper limits of normal for size. The visualized bony structures of the thorax are intact. Left delete permanent pacemaker noted. Telemetry leads overlie the chest. IMPRESSION: No active disease. Electronically Signed   By: Misty Stanley M.D.   On: 07/11/2015 16:12    Scheduled Meds:  Scheduled Meds: . aspirin EC  81 mg Oral Daily  . folic acid  1 mg Oral Daily  . guaiFENesin  600 mg Oral BID  . lactose free nutrition  237 mL Oral TID BM  . metoprolol tartrate  12.5 mg Oral BID  . pantoprazole  40 mg Oral QHS  . sodium chloride flush  3 mL Intravenous Q12H  . thiamine  100 mg Oral Daily   Or  . thiamine  100 mg Intravenous Daily   Continuous Infusions:   Time spent on care of this patient: 20 min   [place], MD 07/12/2015, 3:44 PM    [facility] Office  [phone] Pager - Text Page per [email or web address] If 7PM-7AM, please contact night-coverage [email or web address]

## 2015-07-12 NOTE — Progress Notes (Signed)
VASCULAR LAB PRELIMINARY  PRELIMINARY  PRELIMINARY  PRELIMINARY  Carotid Duplex has been completed.  Left ICA indicates 1-39% stenosis.  Vertebral artery flow is antegrade bilaterally.   Right ICA no evidence of stenosis or plaque.   Breaunna Gottlieb, RVT, RDMS 07/12/2015, 9:30 AM

## 2015-07-12 NOTE — Evaluation (Signed)
Clinical/Bedside Swallow Evaluation Patient Details  Name: Randall Lawrence MRN: [id number] Date of Birth: 02-22-1925  Today's Date: 07/12/2015 Time: SLP Start Time (ACUTE ONLY): 0830 SLP Stop Time (ACUTE ONLY): 0852 SLP Time Calculation (min) (ACUTE ONLY): 22 min  Past Medical History:  Past Medical History  Diagnosis Date  . History of blood clots   . Pulmonary embolism ([place])   . Brain tumor (benign) ([place])   . Leukemia, chronic lymphocytic ([place])   . Hypertension   . Pacemaker    Past Surgical History:  Past Surgical History  Procedure Laterality Date  . Cataracts    . Appendectomy    . Hernia repair     HPI:  Pt is 82 y.o. male with h/o blood clots, pulmonary embolism, benign brain tumor, chronic lymphocytic  leukemia, hypertension, Pacemaker. Pt presented to ED after syncopal episode and poor PO intake. CT Head 1/29 no acute intracranial abnormality. CXR 1/29 no active disease. No found h/o SLP intervention.   Assessment / Plan / Recommendation Clinical Impression    SLP provided skilled observation of pt at bedside during mealtime to assess swallow function. No s/s of penetration/aspiration observed. Pt reported h/o globus sensation, phlegm production, belching during meals, possibly indicative of esophageal dysphagia. Globus sensation and belching were noted throughout assessment. Pt educated re: esophageal precautions, possible esophageal assessment and diet recommendation. Recommend Dysphagia 3 diet due to edentulous status and thin liquids, whole meds in puree. Pt should be seated upright during meals and remain upright at least 30 minutes after meals. No SLP f/u is warranted at this time, as education is complete. MD educated re: esophageal dysphagia symptoms.    Aspiration Risk  Mild aspiration risk    Diet Recommendation Regular;Thin liquid   Liquid Administration via: Cup Medication Administration: Whole meds with puree Supervision: Patient able to self  feed;Intermittent supervision to cue for compensatory strategies Compensations: Minimize environmental distractions;Slow rate;Small sips/bites;Follow solids with liquid Postural Changes: Seated upright at 90 degrees;Remain upright for at least 30 minutes after po intake    Other  Recommendations Recommended Consults: Consider esophageal assessment Oral Care Recommendations: Oral care BID   Follow up Recommendations       Frequency and Duration            Prognosis Prognosis for Safe Diet Advancement: Good      Swallow Study   General HPI: Pt is 82 y.o. male with h/o blood clots, pulmonary embolism, benign brain tumor, chronic lymphocytic  leukemia, hypertension, Pacemaker. Pt presented to ED after syncopal episode and poor PO intake. CT Head 1/29 no acute intracranial abnormality. CXR 1/29 no active disease. No found h/o SLP intervention. Type of Study: Bedside Swallow Evaluation Previous Swallow Assessment: pt reported barium swallow 10 years ago Diet Prior to this Study: Regular;Thin liquids Temperature Spikes Noted: No Respiratory Status: Room air History of Recent Intubation: No Behavior/Cognition: Alert;Cooperative;Pleasant mood Oral Cavity Assessment: Within Functional Limits Oral Care Completed by SLP: No Oral Cavity - Dentition: Edentulous Vision: Functional for self-feeding Self-Feeding Abilities: Able to feed self Patient Positioning: Upright in bed Baseline Vocal Quality: Normal Volitional Cough: Weak Volitional Swallow: Able to elicit    Oral/Motor/Sensory Function Overall Oral Motor/Sensory Function: Within functional limits   Ice Chips Ice chips: Not tested   Thin Liquid Thin Liquid: Impaired Presentation: Cup;Self Fed Other Comments: belching, globus sensation    Nectar Thick Nectar Thick Liquid: Not tested   Honey Thick Honey Thick Liquid: Not tested   Puree Puree: Not tested  Solid   GO   Solid: Impaired Other Comments: belching, globus sensation         Randall Lawrence 07/12/2015,9:52 AM

## 2015-07-12 NOTE — Progress Notes (Signed)
Initial Nutrition Assessment  DOCUMENTATION CODES:   Non-severe (moderate) malnutrition in context of chronic illness  INTERVENTION:  Provide Boost Plus PO TID, each supplement provides 360 kcal and 13 grams of protein Recommend liberalizing diet from Heart Healthy to Regular Encourage PO intake  NUTRITION DIAGNOSIS:   Inadequate oral intake related to poor appetite, dysphagia as evidenced by per patient/family report, energy intake < 75% for > or equal to 1 month, mild depletion of body fat, mild depletion of muscle mass, percent weight loss.   GOAL:   Patient will meet greater than or equal to 90% of their needs   MONITOR:   PO intake, Labs, Weight trends, Skin, I & O's  REASON FOR ASSESSMENT:   Consult Assessment of nutrition requirement/status  ASSESSMENT:   82 y.o. male with h/o blood clots, pulmonary embolism, benign brain tumor, chronic lymphocytic leukemia, hypertension, Pacemaker. Pt presented to ED after syncopal episode and poor PO intake.   Pt states that he has been eating poorly since July due to poor appetite, difficulty swallowing, and due to dentures not fitting well. He used to maintain his weight at 165 lbs, but gained up to 208 lbs when he retired. He reports losing from 208 lbs to 153 lbs in the past 7 months (26% weight loss is severe for time frame). He ate <25% of breakfast this morning (1/2 a mini bagel) which is slightly less than he was eating PTA. He drinks Boost Plus up to twice daily at home. Pt has some mild muscle and fat wasting per nutrition-focused physical exam.   Labs and medications reviewed.   Diet Order:  Diet Heart Room service appropriate?: Yes; Fluid consistency:: Thin  Skin:  Reviewed, no issues  Last BM:  1/29  Height:   Ht Readings from Last 1 Encounters:  07/11/15 5\' 9"  (1.753 m)    Weight:   Wt Readings from Last 1 Encounters:  07/12/15 153 lb 3.2 oz (69.491 kg)    Ideal Body Weight:  72.7 kg  BMI:  Body mass  index is 22.61 kg/(m^2).  Estimated Nutritional Needs:   Kcal:  1600-1800  Protein:  80-90 grams  Fluid:  1.6-1.8 L/day  EDUCATION NEEDS:   No education needs identified at this time  [place], LDN Inpatient Clinical Dietitian Pager: [phone] After Hours Pager: [phone]

## 2015-07-13 DIAGNOSIS — Z7901 Long term (current) use of anticoagulants: Secondary | ICD-10-CM | POA: Diagnosis not present

## 2015-07-13 DIAGNOSIS — C9110 Chronic lymphocytic leukemia of B-cell type not having achieved remission: Secondary | ICD-10-CM | POA: Diagnosis not present

## 2015-07-13 DIAGNOSIS — R55 Syncope and collapse: Secondary | ICD-10-CM | POA: Diagnosis not present

## 2015-07-13 DIAGNOSIS — I2699 Other pulmonary embolism without acute cor pulmonale: Secondary | ICD-10-CM | POA: Diagnosis not present

## 2015-07-13 LAB — APTT
APTT: 120 s — AB (ref 24–37)
aPTT: 152 seconds — ABNORMAL HIGH (ref 24–37)

## 2015-07-13 LAB — URINALYSIS, ROUTINE W REFLEX MICROSCOPIC
BILIRUBIN URINE: NEGATIVE
Glucose, UA: NEGATIVE mg/dL
Hgb urine dipstick: NEGATIVE
Ketones, ur: NEGATIVE mg/dL
NITRITE: NEGATIVE
PROTEIN: NEGATIVE mg/dL
SPECIFIC GRAVITY, URINE: 1.016 (ref 1.005–1.030)
pH: 5.5 (ref 5.0–8.0)

## 2015-07-13 LAB — URINE MICROSCOPIC-ADD ON

## 2015-07-13 LAB — GLUCOSE, CAPILLARY: Glucose-Capillary: 89 mg/dL (ref 65–99)

## 2015-07-13 LAB — HEMOGLOBIN A1C
HEMOGLOBIN A1C: 5.6 % (ref 4.8–5.6)
MEAN PLASMA GLUCOSE: 114 mg/dL

## 2015-07-13 MED ORDER — FUROSEMIDE 10 MG/ML IJ SOLN
40.0000 mg | Freq: Every day | INTRAMUSCULAR | Status: DC
  Administered 2015-07-13 – 2015-07-15 (×3): 40 mg via INTRAVENOUS
  Filled 2015-07-13 (×3): qty 4

## 2015-07-13 MED ORDER — ISOSORBIDE MONONITRATE ER 30 MG PO TB24
30.0000 mg | ORAL_TABLET | Freq: Every day | ORAL | Status: DC
  Administered 2015-07-13 – 2015-07-15 (×3): 30 mg via ORAL
  Filled 2015-07-13 (×3): qty 1

## 2015-07-13 MED ORDER — HEPARIN (PORCINE) IN NACL 100-0.45 UNIT/ML-% IJ SOLN
900.0000 [IU]/h | INTRAMUSCULAR | Status: AC
  Administered 2015-07-13 (×2): 800 [IU]/h via INTRAVENOUS
  Administered 2015-07-15: 900 [IU]/h via INTRAVENOUS
  Filled 2015-07-13 (×3): qty 250

## 2015-07-13 MED ORDER — POLYVINYL ALCOHOL 1.4 % OP SOLN
1.0000 [drp] | Freq: Two times a day (BID) | OPHTHALMIC | Status: DC | PRN
  Administered 2015-07-14 – 2015-07-15 (×2): 1 [drp] via OPHTHALMIC
  Filled 2015-07-13: qty 15

## 2015-07-13 MED ORDER — HYDRALAZINE HCL 10 MG PO TABS
10.0000 mg | ORAL_TABLET | Freq: Three times a day (TID) | ORAL | Status: DC
  Administered 2015-07-13 – 2015-07-15 (×6): 10 mg via ORAL
  Filled 2015-07-13 (×7): qty 1

## 2015-07-13 NOTE — Progress Notes (Signed)
ANTICOAGULATION CONSULT NOTE - Follow Up Consult  Pharmacy Consult for heparin Indication: pulmonary embolus   Labs:  Recent Labs  07/11/15 1550 07/11/15 1631 07/11/15 2123 07/12/15 0327 07/12/15 1159 07/12/15 1703 07/13/15 0531  HGB 14.1 16.3  --  13.6  --   --   --   HCT 43.1 48.0  --  41.5  --   --   --   PLT 178  --   --  180  --   --   --   APTT  --   --   --   --   --  32 152*  HEPARINUNFRC  --   --   --   --   --  >2.20*  --   CREATININE 0.87 0.80  --  0.88  --   --   --   TROPONINI  --   --  <0.03 <0.03 <0.03  --   --     Assessment: 82yo male supratherapeutic on heparin after started for new PE while on Eliquis; lab collected correctly in opposite arm.  Goal of Therapy:  aPTT 66-102 seconds   Plan:  Will hold heparin gtt x1hr then resume at lower rate of 900 units/hr and check PTT in [place], PharmD, BCPS  07/13/2015,7:31 AM

## 2015-07-13 NOTE — Progress Notes (Signed)
[facility] Progress Note   Randall Lawrence  [id number]  DOB: 1924-07-12  DOA: 07/11/2015 PCP: Pcp Not In System  Brief narrative: Randall Lawrence is a 82 y.o. male with h/o PE, HTN CLL is in [place] visiting family and presents after a syncopal episode yesterday. He admits to dyspnea and dizziness at home. EMS found him to be hypotensive with BP of 90/51.    Subjective: Short of breath on exertion and at times at rest. No cough.   Assessment/Plan: Principal Problem:   Syncope and collapse/  Acute pulmonary embolism - no c/o dyspnea and not hypoxic but  VQ is high probability for PE in RUL and RLL - h/o DVTS in past as well- no need to obtain Venous duplex as it will not change management - have switched Apixaban to Heparin - possible hypercoaguable state due to CLL -  ECHO does not reveal a thrombus- troponin negative  - heat CT reveals a meningioma and chronic microvascular disease - check ambulatory pulse ox today to determine need for home O2  Active Problems:  Systolic CHF - EF [id number] %- new finding - will start Hydralazine/ Imdur- allergic to ACE and ARB - legs quite swollen- will start Lasix daily and follow    Hypertension - cont metoprolol     CLL (chronic lymphocytic leukemia)     Malnutrition of moderate degree - will start supplements    Antibiotics: Anti-infectives    None     Code Status:     Code Status Orders        Start     Ordered   07/11/15 2054  Full code   Continuous     07/11/15 2053    Code Status History    Date Active Date Inactive Code Status Order ID Comments User Context   This patient has a current code status but no historical code status.     Family Communication:  Disposition Plan: home in 1-2 days DVT prophylaxis: [place] Consultants: hematology Procedures: VQ, ECHO    Objective: Filed Weights   07/11/15 2158 07/12/15 0502 07/13/15 0404  Weight: 69.446 kg (153 lb 1.6 oz) 69.491 kg (153 lb 3.2 oz) 69.809  kg (153 lb 14.4 oz)    Intake/Output Summary (Last 24 hours) at 07/13/15 1309 Last data filed at 07/13/15 1222  Gross per 24 hour  Intake 651.33 ml  Output    975 ml  Net -323.67 ml     Vitals Filed Vitals:   07/12/15 2036 07/13/15 0404 07/13/15 0743 07/13/15 1100  BP: 126/84 140/77 129/88 121/93  Pulse: 81 76 82 85  Temp: 98.5 F (36.9 C) 98 F (36.7 C)    TempSrc: Oral Oral    Resp: 16 17 18 16   Height:      Weight:  69.809 kg (153 lb 14.4 oz)    SpO2: 100% 99% 98% 100%    Exam:  General:  Pt is alert, not in acute distress  HEENT: No icterus, No thrush, oral mucosa moist  Cardiovascular: regular rate and rhythm, S1/S2 No murmur  Respiratory: clear to auscultation bilaterally   Abdomen: Soft, +Bowel sounds, non tender, non distended, no guarding  MSK: No cyanosis or clubbing- 2+ pedal edema   Data Reviewed: Basic Metabolic Panel:  Recent Labs Lab 07/11/15 1550 07/11/15 1631 07/12/15 0327  NA 140 138 144  K 3.6 5.0 3.7  CL 101 104 107  CO2 27  --  29  GLUCOSE 115* 92 119*  BUN  13 19 11   CREATININE 0.87 0.80 0.88  CALCIUM 10.3  --  9.6  MG  --   --  1.8  PHOS  --   --  3.4   Liver Function Tests:  Recent Labs Lab 07/12/15 0327  AST 16  ALT <5*  ALKPHOS 88  BILITOT 0.6  PROT 6.1*  ALBUMIN 3.5   No results for input(s): LIPASE, AMYLASE in the last 168 hours. No results for input(s): AMMONIA in the last 168 hours. CBC:  Recent Labs Lab 07/11/15 1550 07/11/15 1631 07/12/15 0327  WBC 8.4  --  9.1  NEUTROABS 3.1  --  2.7  HGB 14.1 16.3 13.6  HCT 43.1 48.0 41.5  MCV 90.9  --  91.4  PLT 178  --  180   Cardiac Enzymes:  Recent Labs Lab 07/11/15 2123 07/12/15 0327 07/12/15 1159  TROPONINI <0.03 <0.03 <0.03   BNP (last 3 results) No results for input(s): BNP in the last 8760 hours.  ProBNP (last 3 results) No results for input(s): PROBNP in the last 8760 hours.  CBG:  Recent Labs Lab 07/11/15 1509 07/13/15 0605  GLUCAP  81 89    No results found for this or any previous visit (from the past 240 hour(s)).   Studies: Ct Head Wo Contrast  07/11/2015  CLINICAL DATA:  Shortness breath and headache. Dizziness and syncope today. No trauma. EXAM: CT HEAD WITHOUT CONTRAST TECHNIQUE: Contiguous axial images were obtained from the base of the skull through the vertex without intravenous contrast. COMPARISON:  None. FINDINGS: Moderate periventricular and diffuse subcortical white matter hypoattenuation is present bilaterally. The basal ganglia are intact. The insular ribbon is normal bilaterally. No focal cortical defects are present. Atherosclerotic calcifications are present in the cavernous internal carotid arteries bilaterally is well is the dural margin of the vertebral arteries. 82 cm hyperdense dural-based mass is associated with the superior sagittal sinus on the left. This is most compatible with a meningioma. No other focal mass lesions are present. Ventricles are proportionate to the degree of atrophy. No significant extra-axial fluid collection is present. IMPRESSION: 1. No acute intracranial abnormality. 2. Moderate periventricular and subcortical white matter disease bilaterally. This likely reflects the sequela of chronic microvascular ischemia. 3. 2 cm hyperdense extra-axial dural-based mass lesion near the vertex compatible with a meningioma. Electronically Signed   By: San Morelle M.D.   On: 07/11/2015 18:05   Nm Pulmonary Perf And Vent  07/12/2015  CLINICAL DATA:  Syncope and collapse, shortness of breath, history brain tumor, pulmonary embolism, chronic lymphocytic leukemia, hypertension EXAM: NUCLEAR MEDICINE VENTILATION - PERFUSION LUNG SCAN TECHNIQUE: Ventilation images were obtained in multiple projections using inhaled aerosol Tc-65m DTPA. Perfusion images were obtained in multiple projections after intravenous injection of Tc-56m MAA. RADIOPHARMACEUTICALS:  99991111 millicuries [id number] DTPA  aerosol inhalation and 4.2 millicuries [id number] MAA IV COMPARISON:  None Radiographic correlation:  Chest radiograph 07/11/2015 FINDINGS: Ventilation: Central airway deposition of tracer. No focal ventilatory defects. Photopenic defect from pacemaker generator LEFT chest. Perfusion: Absent perfusion in RIGHT upper lobe. Subsegmental perfusion defect RIGHT lower lobe, potentially an additional small subsegmental perfusion defect in RIGHT lower lobe. These areas ventilate normally. Normal perfusion to LEFT lung. Chest radiograph: Enlargement of cardiac silhouette post pacemaker. No gross infiltrates. IMPRESSION: Absent perfusion RIGHT upper lobe with additional subsegmental perfusion defect RIGHT lower lobe with normal ventilation in these areas. Findings represent a high probability for pulmonary embolism. Findings called to Janett Billow RN on [place] on 07/12/2015 at 1431  hours. Electronically Signed   By: Lavonia Dana M.D.   On: 07/12/2015 14:31   Dg Chest Portable 1 View  07/11/2015  CLINICAL DATA:  Dizziness with headaches and severely shortness of breath off and on for several weeks. EXAM: PORTABLE CHEST 1 VIEW COMPARISON:  None. FINDINGS: 1556 hours. Lordotic patient positioning. The lungs are clear wiithout focal pneumonia, edema, pneumothorax or pleural effusion. Cardiopericardial silhouette is at upper limits of normal for size. The visualized bony structures of the thorax are intact. Left delete permanent pacemaker noted. Telemetry leads overlie the chest. IMPRESSION: No active disease. Electronically Signed   By: Misty Stanley M.D.   On: 07/11/2015 16:12    Scheduled Meds:  Scheduled Meds: . aspirin EC  81 mg Oral Daily  . folic acid  1 mg Oral Daily  . furosemide  40 mg Intravenous Daily  . guaiFENesin  600 mg Oral BID  . hydrALAZINE  10 mg Oral 3 times per day  . isosorbide mononitrate  30 mg Oral Daily  . lactose free nutrition  237 mL Oral TID BM  . metoprolol tartrate  12.5 mg Oral BID   . pantoprazole  40 mg Oral QHS  . sodium chloride flush  3 mL Intravenous Q12H  . thiamine  100 mg Oral Daily   Continuous Infusions: . heparin 900 Units/hr (07/13/15 0901)    Time spent on care of this patient: 32 min   [place], MD 07/13/2015, 1:09 PM    [facility] Office  [phone] Pager - Text Page per [email or web address] If 7PM-7AM, please contact night-coverage [email or web address]

## 2015-07-13 NOTE — Progress Notes (Signed)
[place] Dr Wynelle Cleveland made awarwe of o2 sat on ambulation .kept O2 @2l /m [place]

## 2015-07-13 NOTE — Progress Notes (Signed)
[place] for heparin Indication: pulmonary embolus  Allergies  Allergen Reactions  . Iodine Hives    Reaction to topical povidine iodine  . Ivp Dye [Iodinated Diagnostic Agents] Hives    [facility] states that [facility]  . Lisinopril Hives  . Losartan Hives  . Zocor [Simvastatin] Hives    Patient Measurements: Height: 5\' 9"  (175.3 cm) Weight: 153 lb 14.4 oz (69.809 kg) (a scale) IBW/kg (Calculated) : 70.7 Heparin Dosing Weight: 69.5 kg  Vital Signs: BP: 121/93 mmHg (01/31 1100) Pulse Rate: 85 (01/31 1100)  Labs:  Recent Labs  07/11/15 1550 07/11/15 1631 07/11/15 2123 07/12/15 0327 07/12/15 1159 07/12/15 1703 07/13/15 0531 07/13/15 1624  HGB 14.1 16.3  --  13.6  --   --   --   --   HCT 43.1 48.0  --  41.5  --   --   --   --   PLT 178  --   --  180  --   --   --   --   APTT  --   --   --   --   --  32 152* 120*  HEPARINUNFRC  --   --   --   --   --  >2.20*  --   --   CREATININE 0.87 0.80  --  0.88  --   --   --   --   TROPONINI  --   --  <0.03 <0.03 <0.03  --   --   --     Estimated Creatinine Clearance: 57.3 mL/min (by C-G formula based on Cr of 0.88).  Assessment: 23 YOM with hx of PE, on Eliquis 5mg  BID PTA, who presented after a syncopal episode yesterday, VQ scan is high probability for PE in RUL and RLL. Pharmacy is consulted to switch eliquis to IV heparin. Last dose of Eliquis was 1/30 at ~10AM.  APTT has been elevated, earlier today was 152, heparin held and resumed at lower rate. Now aPTT 120 which is still above goal. No bleeding noted.  Goal of Therapy:  APTT = 66-102 Heparin level 0.3-0.7 units/ml Monitor platelets by anticoagulation protocol: Yes   Plan:  - Reduce heparin to 800units/hr (~1.5unit/kg/hr decrease) - Recheck aPTT and HL with AM labs - Daily CBC - Follow for s/s bleeding  Arohi Salvatierra D. Tashanna Dolin, PharmD, BCPS Clinical Pharmacist Pager: [phone] 07/13/2015 5:18 PM

## 2015-07-14 ENCOUNTER — Observation Stay (HOSPITAL_COMMUNITY): Payer: Medicare Other

## 2015-07-14 DIAGNOSIS — R609 Edema, unspecified: Secondary | ICD-10-CM | POA: Diagnosis not present

## 2015-07-14 DIAGNOSIS — M10061 Idiopathic gout, right knee: Secondary | ICD-10-CM | POA: Diagnosis not present

## 2015-07-14 DIAGNOSIS — Z86711 Personal history of pulmonary embolism: Secondary | ICD-10-CM | POA: Diagnosis not present

## 2015-07-14 DIAGNOSIS — I5020 Unspecified systolic (congestive) heart failure: Secondary | ICD-10-CM | POA: Diagnosis present

## 2015-07-14 DIAGNOSIS — I10 Essential (primary) hypertension: Secondary | ICD-10-CM | POA: Diagnosis present

## 2015-07-14 DIAGNOSIS — I2609 Other pulmonary embolism with acute cor pulmonale: Secondary | ICD-10-CM

## 2015-07-14 DIAGNOSIS — I82413 Acute embolism and thrombosis of femoral vein, bilateral: Secondary | ICD-10-CM | POA: Diagnosis present

## 2015-07-14 DIAGNOSIS — D329 Benign neoplasm of meninges, unspecified: Secondary | ICD-10-CM

## 2015-07-14 DIAGNOSIS — Z6823 Body mass index (BMI) 23.0-23.9, adult: Secondary | ICD-10-CM | POA: Diagnosis not present

## 2015-07-14 DIAGNOSIS — I2699 Other pulmonary embolism without acute cor pulmonale: Principal | ICD-10-CM

## 2015-07-14 DIAGNOSIS — Z79899 Other long term (current) drug therapy: Secondary | ICD-10-CM | POA: Diagnosis not present

## 2015-07-14 DIAGNOSIS — Z950 Presence of cardiac pacemaker: Secondary | ICD-10-CM | POA: Diagnosis not present

## 2015-07-14 DIAGNOSIS — I4892 Unspecified atrial flutter: Secondary | ICD-10-CM | POA: Diagnosis present

## 2015-07-14 DIAGNOSIS — I481 Persistent atrial fibrillation: Secondary | ICD-10-CM | POA: Diagnosis present

## 2015-07-14 DIAGNOSIS — Z7901 Long term (current) use of anticoagulants: Secondary | ICD-10-CM | POA: Diagnosis not present

## 2015-07-14 DIAGNOSIS — I5021 Acute systolic (congestive) heart failure: Secondary | ICD-10-CM | POA: Diagnosis present

## 2015-07-14 DIAGNOSIS — C9110 Chronic lymphocytic leukemia of B-cell type not having achieved remission: Secondary | ICD-10-CM | POA: Diagnosis present

## 2015-07-14 DIAGNOSIS — R0600 Dyspnea, unspecified: Secondary | ICD-10-CM

## 2015-07-14 DIAGNOSIS — Z9849 Cataract extraction status, unspecified eye: Secondary | ICD-10-CM | POA: Diagnosis not present

## 2015-07-14 DIAGNOSIS — Z91041 Radiographic dye allergy status: Secondary | ICD-10-CM | POA: Diagnosis not present

## 2015-07-14 DIAGNOSIS — I483 Typical atrial flutter: Secondary | ICD-10-CM | POA: Diagnosis not present

## 2015-07-14 DIAGNOSIS — I951 Orthostatic hypotension: Secondary | ICD-10-CM | POA: Diagnosis present

## 2015-07-14 DIAGNOSIS — M109 Gout, unspecified: Secondary | ICD-10-CM | POA: Diagnosis present

## 2015-07-14 DIAGNOSIS — Z888 Allergy status to other drugs, medicaments and biological substances status: Secondary | ICD-10-CM | POA: Diagnosis not present

## 2015-07-14 DIAGNOSIS — R55 Syncope and collapse: Secondary | ICD-10-CM | POA: Diagnosis present

## 2015-07-14 DIAGNOSIS — E440 Moderate protein-calorie malnutrition: Secondary | ICD-10-CM | POA: Diagnosis present

## 2015-07-14 DIAGNOSIS — Z7982 Long term (current) use of aspirin: Secondary | ICD-10-CM | POA: Diagnosis not present

## 2015-07-14 DIAGNOSIS — M1711 Unilateral primary osteoarthritis, right knee: Secondary | ICD-10-CM | POA: Diagnosis present

## 2015-07-14 DIAGNOSIS — I429 Cardiomyopathy, unspecified: Secondary | ICD-10-CM | POA: Diagnosis present

## 2015-07-14 DIAGNOSIS — R42 Dizziness and giddiness: Secondary | ICD-10-CM | POA: Diagnosis present

## 2015-07-14 LAB — GLUCOSE, CAPILLARY: Glucose-Capillary: 86 mg/dL (ref 65–99)

## 2015-07-14 LAB — CBC
HEMATOCRIT: 40 % (ref 39.0–52.0)
Hemoglobin: 12.8 g/dL — ABNORMAL LOW (ref 13.0–17.0)
MCH: 29.4 pg (ref 26.0–34.0)
MCHC: 32 g/dL (ref 30.0–36.0)
MCV: 91.7 fL (ref 78.0–100.0)
Platelets: 196 10*3/uL (ref 150–400)
RBC: 4.36 MIL/uL (ref 4.22–5.81)
RDW: 16.3 % — AB (ref 11.5–15.5)
WBC: 9.2 10*3/uL (ref 4.0–10.5)

## 2015-07-14 LAB — HEPARIN LEVEL (UNFRACTIONATED)
HEPARIN UNFRACTIONATED: 1.1 [IU]/mL — AB (ref 0.30–0.70)
HEPARIN UNFRACTIONATED: 0.88 [IU]/mL — AB (ref 0.30–0.70)

## 2015-07-14 LAB — APTT
aPTT: 127 seconds — ABNORMAL HIGH (ref 24–37)
aPTT: 82 seconds — ABNORMAL HIGH (ref 24–37)
aPTT: 53 seconds — ABNORMAL HIGH (ref 24–37)

## 2015-07-14 MED ORDER — SODIUM CHLORIDE 0.9 % IV BOLUS (SEPSIS)
500.0000 mL | Freq: Once | INTRAVENOUS | Status: AC
  Administered 2015-07-15: 500 mL via INTRAVENOUS

## 2015-07-14 NOTE — Progress Notes (Signed)
ANTICOAGULATION CONSULT NOTE - Follow Up Consult  Pharmacy Consult for Heparin Indication: pulmonary embolus  Allergies  Allergen Reactions  . Iodine Hives    Reaction to topical povidine iodine  . Ivp Dye [Iodinated Diagnostic Agents] Hives    [facility] states that [facility]  . Lisinopril Hives  . Losartan Hives  . Zocor [Simvastatin] Hives    Patient Measurements: Height: 5\' 9"  (175.3 cm) Weight: 155 lb 4.8 oz (70.444 kg) IBW/kg (Calculated) : 70.7 Heparin Dosing Weight: 70 kg  Vital Signs: Temp: 97.5 F (36.4 C) (02/01 1800) Temp Source: Oral (02/01 1800) BP: 101/74 mmHg (02/01 1800) Pulse Rate: 88 (02/01 1800)  Labs:  Recent Labs  07/11/15 2123 07/12/15 0327 07/12/15 1159 07/12/15 1703  07/14/15 0420 07/14/15 0915 07/14/15 1656  HGB  --  13.6  --   --   --  12.8*  --   --   HCT  --  41.5  --   --   --  40.0  --   --   PLT  --  180  --   --   --  196  --   --   APTT  --   --   --  32  < > 127* 82* 53*  HEPARINUNFRC  --   --   --  >2.20*  --  1.10* 0.88*  --   CREATININE  --  0.88  --   --   --   --   --   --   TROPONINI <0.03 <0.03 <0.03  --   --   --   --   --   < > = values in this interval not displayed.  Estimated Creatinine Clearance: 57.8 mL/min (by C-G formula based on Cr of 0.88).  Assessment: 63 YOM with hx of PE November 2016, on Eliquis 5mg  BID prior to admission. Admitted 07/11/15 after syncopal episode. VQ scan is high probability for PE in RUL and RLL. Changed from Eliquis to IV heparin on 1/30 pm. Last Eliquis dose 1/30 am. aPTTs have been high, and heparin rate has been decreased several times. Now aPTT low at 53. No issues documented.   Goal of Therapy:  Heparin level 0.3-0.7 units/ml aPTT 66-102 seconds Monitor platelets by anticoagulation protocol: Yes   Plan:  Increase heparin drip to 750 units/hr Check 8 hr aPTT tomorrow morning Monitor daily aPTT / HL, CBC, s/s of bleed  Will follow up anticoag plans;   Eliquis vs therapeutic Lovenox.  Elenor Quinones, PharmD, BCPS Clinical Pharmacist Pager [phone] 07/14/2015 6:22 PM

## 2015-07-14 NOTE — Progress Notes (Signed)
1830 pt resting in chair with spouse in the room. On heparin @ 7.5 ml/hr via pump as ordered

## 2015-07-14 NOTE — Progress Notes (Signed)
Pt's BP was 92/52 and complains of dizziness. K. Schorr was notified and ordered to give 500 ml NSS bolus once and hold the Metoprolol PO and Hydralazine PO tonight. Will continue to monitor pt

## 2015-07-14 NOTE — Progress Notes (Signed)
[facility]                                                                              Patient Demographics  Randall Lawrence, is a 82 y.o. male, DOB - [birth year], [id number]  Admit date - 07/11/2015   Admitting Physician Toy Baker, MD  Outpatient Primary MD for the patient is Pcp Not In System  LOS - 0   Chief Complaint  Patient presents with  . Loss of Consciousness  . Shortness of Breath       Brief HPI  Randall Lawrence is a 82 y.o. male with h/o PE, HTN CLL is in [place] visiting family and presented after a syncopal episode. He admitted to dyspnea and dizziness at home. EMS found him to be hypotensive with BP of 90/51.     Assessment & Plan   Principal Problem:  Syncope and collapse/ Acute pulmonary embolism -  Currently on 2 L O2 via nasal cannula, reports not on any O2 outpatient.  VQ is high probability for PE in RUL and RLL - h/o bilateral DVTS in past as well- no need to obtain Venous duplex as it will not change management - have switched Apixaban to Heparin - possible hypercoaguable state due to CLL - ECHO does not reveal a thrombus- troponin negative  - head CT reveals a meningioma and chronic microvascular disease - Discussed in detail with hematology, Dr. Irene Limbo, recommended to obtain medical records from [facility] patient's prior imagings of CT angiogram. If patient has pulmonary embolism in the same areas and then he may continue of apixaban. However if new areas of acute PE are involved, patient will need therapeutic Lovenox and follow up outpatient with hematology - I have requested records from patient's [place] cardiology office in [facility]  Active Problems: Systolic CHF - EF 123XX123 %- new finding - will start Hydralazine/ Imdur- allergic to ACE and ARB - legs quite swollen- will start Lasix daily and follow   Hypertension - cont metoprolol    CLL (chronic lymphocytic leukemia)    Malnutrition of  moderate degree - will start supplements  Code Status: fc  Family Communication: Discussed in detail with the patient, all imaging results, lab results explained to the patient   Disposition Plan:   Time Spent in minutes  25 minutes  Procedures  VQ scan  Consults   hematology  DVT Prophylaxis  heparin   Medications  Scheduled Meds: . aspirin EC  81 mg Oral Daily  . folic acid  1 mg Oral Daily  . furosemide  40 mg Intravenous Daily  . guaiFENesin  600 mg Oral BID  . hydrALAZINE  10 mg Oral 3 times per day  . isosorbide mononitrate  30 mg Oral Daily  . lactose free nutrition  237 mL Oral TID BM  . metoprolol tartrate  12.5 mg Oral BID  . pantoprazole  40 mg Oral QHS  . sodium chloride flush  3 mL Intravenous Q12H  . thiamine  100 mg Oral Daily   Continuous Infusions: . heparin 600 Units/hr (07/14/15 0518)   PRN Meds:.acetaminophen, LORazepam **OR** LORazepam, polyvinyl alcohol, technetium TC  65M diethylenetriame-pentaacetic acid   Antibiotics   Anti-infectives    None        Subjective:   [name] was seen and examined today.  Shortness of breath +. No chest pain. Patient denies dizziness, abdominal pain, N/V/D/C, new weakness, numbess, tingling. No acute events overnight.    Objective:   Blood pressure 114/87, pulse 83, temperature 98.6 F (37 C), temperature source Oral, resp. rate 16, height 5\' 9"  (1.753 m), weight 70.444 kg (155 lb 4.8 oz), SpO2 100 %.  Wt Readings from Last 3 Encounters:  07/14/15 70.444 kg (155 lb 4.8 oz)     Intake/Output Summary (Last 24 hours) at 07/14/15 1113 Last data filed at 07/14/15 1052  Gross per 24 hour  Intake 434.72 ml  Output   2015 ml  Net -1580.28 ml    Exam  General: Alert and oriented x 3, NAD  HEENT:  PERRLA, EOMI, Anicteric Sclera, mucous membranes moist.   Neck: Supple, no JVD, no masses  CVS: S1 S2 auscultated, no rubs, murmurs or gallops. Regular rate and rhythm.  Respiratory: Clear to  auscultation bilaterally, no wheezing, rales or rhonchi  Abdomen: Soft, nontender, nondistended, + bowel sounds  Ext: no cyanosis clubbing, 2+ edema  Neuro: AAOx3, Cr N's II- XII. Strength 5/5 upper and lower extremities bilaterally  Skin: No rashes  Psych: Normal affect and demeanor, alert and oriented x3    Data Review   Micro Results No results found for this or any previous visit (from the past 240 hour(s)).  Radiology Reports Ct Head Wo Contrast  07/11/2015  CLINICAL DATA:  Shortness breath and headache. Dizziness and syncope today. No trauma. EXAM: CT HEAD WITHOUT CONTRAST TECHNIQUE: Contiguous axial images were obtained from the base of the skull through the vertex without intravenous contrast. COMPARISON:  None. FINDINGS: Moderate periventricular and diffuse subcortical white matter hypoattenuation is present bilaterally. The basal ganglia are intact. The insular ribbon is normal bilaterally. No focal cortical defects are present. Atherosclerotic calcifications are present in the cavernous internal carotid arteries bilaterally is well is the dural margin of the vertebral arteries. 82 cm hyperdense dural-based mass is associated with the superior sagittal sinus on the left. This is most compatible with a meningioma. No other focal mass lesions are present. Ventricles are proportionate to the degree of atrophy. No significant extra-axial fluid collection is present. IMPRESSION: 1. No acute intracranial abnormality. 2. Moderate periventricular and subcortical white matter disease bilaterally. This likely reflects the sequela of chronic microvascular ischemia. 3. 2 cm hyperdense extra-axial dural-based mass lesion near the vertex compatible with a meningioma. Electronically Signed   By: San Morelle M.D.   On: 07/11/2015 18:05   Nm Pulmonary Perf And Vent  07/12/2015  CLINICAL DATA:  Syncope and collapse, shortness of breath, history brain tumor, pulmonary embolism, chronic  lymphocytic leukemia, hypertension EXAM: NUCLEAR MEDICINE VENTILATION - PERFUSION LUNG SCAN TECHNIQUE: Ventilation images were obtained in multiple projections using inhaled aerosol Tc-16m DTPA. Perfusion images were obtained in multiple projections after intravenous injection of Tc-81m MAA. RADIOPHARMACEUTICALS:  99991111 millicuries [id number] DTPA aerosol inhalation and 4.2 millicuries [id number] MAA IV COMPARISON:  None Radiographic correlation:  Chest radiograph 07/11/2015 FINDINGS: Ventilation: Central airway deposition of tracer. No focal ventilatory defects. Photopenic defect from pacemaker generator LEFT chest. Perfusion: Absent perfusion in RIGHT upper lobe. Subsegmental perfusion defect RIGHT lower lobe, potentially an additional small subsegmental perfusion defect in RIGHT lower lobe. These areas ventilate normally. Normal perfusion to LEFT lung. Chest radiograph: Enlargement of  cardiac silhouette post pacemaker. No gross infiltrates. IMPRESSION: Absent perfusion RIGHT upper lobe with additional subsegmental perfusion defect RIGHT lower lobe with normal ventilation in these areas. Findings represent a high probability for pulmonary embolism. Findings called to Janett Billow RN on [place] on 07/12/2015 at 1431 hours. Electronically Signed   By: Lavonia Dana M.D.   On: 07/12/2015 14:31   Dg Chest Portable 1 View  07/11/2015  CLINICAL DATA:  Dizziness with headaches and severely shortness of breath off and on for several weeks. EXAM: PORTABLE CHEST 1 VIEW COMPARISON:  None. FINDINGS: 1556 hours. Lordotic patient positioning. The lungs are clear wiithout focal pneumonia, edema, pneumothorax or pleural effusion. Cardiopericardial silhouette is at upper limits of normal for size. The visualized bony structures of the thorax are intact. Left delete permanent pacemaker noted. Telemetry leads overlie the chest. IMPRESSION: No active disease. Electronically Signed   By: Misty Stanley M.D.   On: 07/11/2015 16:12     CBC  Recent Labs Lab 07/11/15 1550 07/11/15 1631 07/12/15 0327 07/14/15 0420  WBC 8.4  --  9.1 9.2  HGB 14.1 16.3 13.6 12.8*  HCT 43.1 48.0 41.5 40.0  PLT 178  --  180 196  MCV 90.9  --  91.4 91.7  MCH 29.7  --  30.0 29.4  MCHC 32.7  --  32.8 32.0  RDW 16.3*  --  16.6* 16.3*  LYMPHSABS 4.6*  --  5.6*  --   MONOABS 0.5  --  0.6  --   EOSABS 0.2  --  0.2  --   BASOSABS 0.0  --  0.0  --     Chemistries   Recent Labs Lab 07/11/15 1550 07/11/15 1631 07/12/15 0327  NA 140 138 144  K 3.6 5.0 3.7  CL 101 104 107  CO2 27  --  29  GLUCOSE 115* 92 119*  BUN 13 19 11   CREATININE 0.87 0.80 0.88  CALCIUM 10.3  --  9.6  MG  --   --  1.8  AST  --   --  16  ALT  --   --  <5*  ALKPHOS  --   --  88  BILITOT  --   --  0.6   ------------------------------------------------------------------------------------------------------------------ estimated creatinine clearance is 57.8 mL/min (by C-G formula based on Cr of 0.88). ------------------------------------------------------------------------------------------------------------------  Recent Labs  07/12/15 0327  HGBA1C 5.6   ------------------------------------------------------------------------------------------------------------------ No results for input(s): CHOL, HDL, LDLCALC, TRIG, CHOLHDL, LDLDIRECT in the last 72 hours. ------------------------------------------------------------------------------------------------------------------ No results for input(s): TSH, T4TOTAL, T3FREE, THYROIDAB in the last 72 hours.  Invalid input(s): FREET3 ------------------------------------------------------------------------------------------------------------------ No results for input(s): VITAMINB12, FOLATE, FERRITIN, TIBC, IRON, RETICCTPCT in the last 72 hours.  Coagulation profile No results for input(s): INR, PROTIME in the last 168 hours.  No results for input(s): DDIMER in the last 72 hours.  Cardiac Enzymes  Recent  Labs Lab 07/11/15 2123 07/12/15 0327 07/12/15 1159  TROPONINI <0.03 <0.03 <0.03   ------------------------------------------------------------------------------------------------------------------ Invalid input(s): POCBNP   Recent Labs  07/11/15 1509 07/13/15 0605 07/14/15 0545  GLUCAP 81 42 86     RAI,RIPUDEEP M.D. [facility] 07/14/2015, 11:13 AM  Pager: [phone] Between 7am to 7pm - call Pager - [phone]  After 7pm go to [email or web address] - password [email or web address]  Call night coverage person covering after 7pm

## 2015-07-14 NOTE — Progress Notes (Signed)
[place] for heparin Indication: pulmonary embolus  Allergies  Allergen Reactions  . Iodine Hives    Reaction to topical povidine iodine  . Ivp Dye [Iodinated Diagnostic Agents] Hives    [facility]  . Lisinopril Hives  . Losartan Hives  . Zocor [Simvastatin] Hives    Patient Measurements: Height: 5\' 9"  (175.3 cm) Weight: 153 lb 14.4 oz (69.809 kg) (a scale) IBW/kg (Calculated) : 70.7 Heparin Dosing Weight: 69.5 kg  Vital Signs: Temp: 97.9 F (36.6 C) (01/31 2057) Temp Source: Oral (01/31 2057) BP: 109/73 mmHg (01/31 2057) Pulse Rate: 86 (01/31 2057)  Labs:  Recent Labs  07/11/15 1550 07/11/15 1631 07/11/15 2123 07/12/15 0327 07/12/15 1159  07/12/15 1703 07/13/15 0531 07/13/15 1624 07/14/15 0420  HGB 14.1 16.3  --  13.6  --   --   --   --   --  12.8*  HCT 43.1 48.0  --  41.5  --   --   --   --   --  40.0  PLT 178  --   --  180  --   --   --   --   --  196  APTT  --   --   --   --   --   < > 32 152* 120* 127*  HEPARINUNFRC  --   --   --   --   --   --  >2.20*  --   --  1.10*  CREATININE 0.87 0.80  --  0.88  --   --   --   --   --   --   TROPONINI  --   --  <0.03 <0.03 <0.03  --   --   --   --   --   < > = values in this interval not displayed.  Estimated Creatinine Clearance: 57.3 mL/min (by C-G formula based on Cr of 0.88).  Assessment: 43 YOM with hx of PE, on Eliquis 5mg  BID PTA, who presented after a syncopal episode yesterday, VQ scan is high probability for PE in RUL and RLL. Pharmacy is consulted to switch eliquis to IV heparin. Last dose of Eliquis was 1/30 at ~10AM.  APTT has been elevated, earlier today was 152, heparin held and resumed at lower rate. Now aPTT 127 which is still above goal.  No bleeding noted.  Goal of Therapy:  APTT = 66-102 Heparin level 0.3-0.7 units/ml Monitor platelets by anticoagulation protocol: Yes   Plan:  - Reduce heparin to 600units/hr   - Recheck aPTT and HL in 6 hours  Thank you Anette Guarneri, PharmD [phone] 07/14/2015 5:19 AM

## 2015-07-14 NOTE — Progress Notes (Signed)
VASCULAR LAB PRELIMINARY  PRELIMINARY  PRELIMINARY  PRELIMINARY  Bilateral lower extremity venous duplex completed.    Preliminary report:  Technically difficult to evaluate age of the previous DVT of 03/2015 in [place]. No previous evaluation available at the time of this exam, There is no obvious evidence of acute DVT, superficial thrombosis, or Baker's cyst bilaterally. There is what appears to be subacute and age indeterminate DVT noted in the femoral and popliteal veins on the right and the common femoral, femoral and popliteal veins on the left.  Gauri Galvao, RVS 07/14/2015, 7:51 PM

## 2015-07-14 NOTE — Progress Notes (Signed)
ANTICOAGULATION CONSULT NOTE - Follow Up Consult  Pharmacy Consult for Heparin Indication: pulmonary embolus  Allergies  Allergen Reactions  . Iodine Hives    Reaction to topical povidine iodine  . Ivp Dye [Iodinated Diagnostic Agents] Hives    [facility] states that [facility]  . Lisinopril Hives  . Losartan Hives  . Zocor [Simvastatin] Hives    Patient Measurements: Height: 5\' 9"  (175.3 cm) Weight: 155 lb 4.8 oz (70.444 kg) IBW/kg (Calculated) : 70.7 Heparin Dosing Weight: 70 kg  Vital Signs: Temp: 98.6 F (37 C) (02/01 0521) Temp Source: Oral (02/01 0521) BP: 129/70 mmHg (02/01 1100) Pulse Rate: 84 (02/01 1100)  Labs:  Recent Labs  07/11/15 1550 07/11/15 1631 07/11/15 2123 07/12/15 0327 07/12/15 1159 07/12/15 1703  07/13/15 1624 07/14/15 0420 07/14/15 0915  HGB 14.1 16.3  --  13.6  --   --   --   --  12.8*  --   HCT 43.1 48.0  --  41.5  --   --   --   --  40.0  --   PLT 178  --   --  180  --   --   --   --  196  --   APTT  --   --   --   --   --  32  < > 120* 127* 82*  HEPARINUNFRC  --   --   --   --   --  >2.20*  --   --  1.10* 0.88*  CREATININE 0.87 0.80  --  0.88  --   --   --   --   --   --   TROPONINI  --   --  <0.03 <0.03 <0.03  --   --   --   --   --   < > = values in this interval not displayed.  Estimated Creatinine Clearance: 57.8 mL/min (by C-G formula based on Cr of 0.88).  Assessment: 85 YOM with hx of PE November 2016, on Eliquis 5mg  BID prior to admission. Admitted 07/11/15 after syncopal episode. VQ scan is high probability for PE in RUL and RLL.   Changed from Eliquis to IV heparin on 1/30 pm. Last Eliquis dose 1/30 am.   aPTTs have been high, and heparin rate has been decreased several times.   Heparin rate decreased from 800 to 600 units/hr ~5am today when aPTT was 127 seconds.  Follow-up aPTT is 82 seconds, at goal. Heparin level is high at 0.88, but skewed by recent Eliquis doses.    Labs drawn ~4 hrs after rate  change, so not quite at steady state.   Noted plan to obtain records from [facility] to compare area of prior PE with current images.   Goal of Therapy:  Heparin level 0.3-0.7 units/ml aPTT 66-102 seconds Monitor platelets by anticoagulation protocol: Yes   Plan:    Continue Heparin drip at 600 units/hr.   Repeat aPTT this afternoon, to be sure it doesn't drop below goal range.   Daily aPTT and heparin level until they correlate.   Will follow up anticoag plans;  Eliquis vs therapeutic Lovenox.  Arty Baumgartner, [place] Pager: [phone] 07/14/2015,12:03 PM

## 2015-07-14 NOTE — Consult Note (Signed)
[facility]    HEMATOLOGY/ONCOLOGY CONSULTATION NOTE  Date of Service: 07/14/2015  Patient Care Team: Pcp Not In System as PCP - General  CHIEF COMPLAINTS/PURPOSE OF CONSULTATION:  VTE while on Eliquis- recommendation regarding anticoagulation  HISTORY OF PRESENTING ILLNESS:  Randall Lawrence is a wonderful 82 y.o. male who has been referred to [place] by Dr Tana Coast for evaluation and management of VTE.  Patient has a reported h/o CLL (currently wbc count normal lymphocyte count borderline increased at 5.6k, never needed treatment), ?SSS (has cardiac pacemaker) who presented with a syncopal event in Nov 2016 at a hospital in [place], [place] . He notes he had an evaluation there and was noted to have bilateral pulmonary embolisms and DVTs. The results and reports of these imaging studies are not available to [place] at this time. Patient notes that he was also evaluated by Dr. Maximiano Coss from [facility] cardiology [phone] [place], [place] [address] - tel number [phone] and fax [phone]). Patient notes that he was on IV heparin initially and then was switched to Eliquis which he has been on since then. He notes that he had some dark stools while in the hospital but has had no issues with bleeding since then. He had an EGD at the time which he reports was unrevealing. Has had a colonoscopy 3-4 years ago which showed 1 polyp that was removed. Patient notes that he has been fairly compliant with his anticoagulation though does report having had about 4-6 minutes doses of his Eliquis over the last 2 weeks. He was visiting his family here in [place], [place] from his home in [place] , [place] and presented to the emergency room with some fatigue and presyncopal symptoms. He notes that he has had some intermittent dizziness on and off since November 2016 and possibly even previously. He notes that he has had some leg swelling bilaterally but that his right calf feels more bothersome.  He also complained of a headache for  which he had a CT of the head in the emergency room that showed an incidental meningioma but no other acute issues. Patient had a initial cardiac workup in the emergency room that was apparently unrevealing. Subsequently had a V/Q scan that showed Absent perfusion RIGHT upper lobe with additional subsegmental perfusion defect RIGHT lower lobe with normal ventilation in these areas.  It is unclear if this represents progressive dual clots from his initial event in November 2016 or an fact a new event. It is difficult to say that based on the absence of his previous CT scan images and results. He has been ordered for bilateral lower except ultrasounds which are pending at this time. Patient is also unclear if these finding on his ventilation perfusion scan clearly explain his presenting symptoms. He is however currently on oxygen though it is uncertain that he actually might need this.  He has added ultrasound of his Carotid arteries which showed a nonocclusive mild carotid artery stenosis on the left side and no significant stenosis on the right.echocardiogram showed reduced  ejection fraction of 40-45%.  When patient was interviewed and examined the room he notes no chest pain no acute shortness of breath. Notes some chronic bilateral lower extremity feeling of coldness with some right-sided leg pain which he said has been there for a bit but potentially might be worse. Notes that he Understands the importance of compliance with his medications which we discussed in great detail.  MEDICAL HISTORY:  Past Medical History  Diagnosis  Date  . History of blood clots   . Pulmonary embolism ([place])   . Brain tumor (benign) ([place])   . Leukemia, chronic lymphocytic ([place])   . Hypertension   . Pacemaker     SURGICAL HISTORY: Past Surgical History  Procedure Laterality Date  . Cataracts    . Appendectomy    . Hernia repair      SOCIAL HISTORY: Social History   Social History  . Marital Status:  Married    Spouse Name: [name]  . Number of Children: [name]  . Years of Education: [name]   Occupational History  . Not on file.   Social History Main Topics  . Smoking status: Never Smoker   . Smokeless tobacco: Not on file  . Alcohol Use: Yes     Comment: 1 pint bourbon daily (plus )   . Drug Use: No  . Sexual Activity: Not on file   Other Topics Concern  . Not on file   Social History Narrative  . No narrative on file    FAMILY HISTORY: Family History  Problem Relation Age of Onset  . CAD Mother   . Peripheral vascular disease Mother     ALLERGIES:  is allergic to iodine; ivp dye; lisinopril; losartan; and zocor.  MEDICATIONS:   . No current facility-administered medications on file prior to encounter.   No current outpatient prescriptions on file prior to encounter.    Current Facility-Administered Medications  Medication Dose Route Frequency Provider Last Rate Last Dose  . acetaminophen (TYLENOL) tablet 650 mg  650 mg Oral Q4H PRN Jeryl Columbia, NP   650 mg at 07/15/15 0757  . aspirin EC tablet 81 mg  81 mg Oral Daily Toy Baker, MD   81 mg at 07/15/15 0943  . folic acid (FOLVITE) tablet 1 mg  1 mg Oral Daily Toy Baker, MD   1 mg at 07/15/15 0943  . furosemide (LASIX) injection 40 mg  40 mg Intravenous Daily Debbe Odea, MD   40 mg at 07/15/15 0943  . guaiFENesin (MUCINEX) 12 hr tablet 600 mg  600 mg Oral BID Toy Baker, MD   600 mg at 07/15/15 0943  . heparin ADULT infusion 100 units/mL (25000 units/250 mL)  900 Units/hr Intravenous Continuous Laren Everts, RPH 9 mL/hr at 07/15/15 0522 900 Units/hr at 07/15/15 0522  . hydrALAZINE (APRESOLINE) tablet 10 mg  10 mg Oral 3 times per day Debbe Odea, MD   Stopped at 07/14/15 2302  . isosorbide mononitrate (IMDUR) 24 hr tablet 30 mg  30 mg Oral Daily Debbe Odea, MD   30 mg at 07/15/15 0943  . lactose free nutrition (BOOST PLUS) liquid 237 mL  237 mL Oral TID BM Reanne J Barbato, RD   237  mL at 07/15/15 1000  . metoprolol tartrate (LOPRESSOR) tablet 12.5 mg  12.5 mg Oral BID Toy Baker, MD   12.5 mg at 07/15/15 0943  . pantoprazole (PROTONIX) EC tablet 40 mg  40 mg Oral QHS Toy Baker, MD   40 mg at 07/14/15 2300  . polyvinyl alcohol (LIQUIFILM TEARS) 1.4 % ophthalmic solution 1 drop  1 drop Both Eyes BID PRN Jeryl Columbia, NP   1 drop at 07/14/15 0002  . sodium chloride flush (NS) 0.9 % injection 3 mL  3 mL Intravenous Q12H Toy Baker, MD   3 mL at 07/15/15 1000  . technetium TC 49M diethylenetriame-pentaacetic acid (DTPA) injection 31.1 milli Curie  31.1 milli Curie Intravenous Once  PRN Toy Baker, MD      . thiamine (VITAMIN B-1) tablet 100 mg  100 mg Oral Daily Toy Baker, MD   100 mg at 07/15/15 [id number]    REVIEW OF SYSTEMS:    10 Point review of Systems was done is negative except as noted above.  PHYSICAL EXAMINATION: ECOG PERFORMANCE STATUS: 2 - Symptomatic, <50% confined to bed  . Filed Vitals:   07/13/15 2057 07/14/15 0521  BP: 109/73 114/87  Pulse: 86 83  Temp: 97.9 F (36.6 C) 98.6 F (37 C)  Resp: 16 16   Filed Weights   07/12/15 0502 07/13/15 0404 07/14/15 0521  Weight: 153 lb 3.2 oz (69.491 kg) 153 lb 14.4 oz (69.809 kg) 155 lb 4.8 oz (70.444 kg)   .Body mass index is 22.92 kg/(m^2).  GENERAL:alert, in no acute distress and comfortable SKIN: skin color, texture, turgor are normal, no rashes or significant lesions EYES: normal, conjunctiva are pink and non-injected, sclera clear OROPHARYNX:no exudate, no erythema and lips, buccal mucosa, and tongue normal  NECK: supple, no JVD, thyroid normal size, non-tender, without nodularity LYMPH:  no palpable lymphadenopathy in the cervical, axillary or inguinal LUNGS: clear to auscultation with normal respiratory effort HEART: regular rate & rhythm,  no murmurs and no lower extremity edema ABDOMEN: abdomen soft, non-tender, normoactive bowel sounds    Musculoskeletal: no cyanosis of digits and no clubbing  PSYCH: alert & oriented x 3 with fluent speech NEURO: no focal motor/sensory deficits  LABORATORY DATA:  I have reviewed the data as listed  . CBC Latest Ref Rng 07/14/2015 07/12/2015 07/11/2015  WBC 4.0 - 10.5 K/uL 9.2 9.1 -  Hemoglobin 13.0 - 17.0 g/dL 12.8(L) 13.6 16.3  Hematocrit 39.0 - 52.0 % 40.0 41.5 48.0  Platelets 150 - 400 K/uL 196 180 -    . CMP Latest Ref Rng 07/12/2015 07/11/2015 07/11/2015  Glucose 65 - 99 mg/dL 119(H) 92 115(H)  BUN 6 - 20 mg/dL 11 19 13   Creatinine 0.61 - 1.24 mg/dL 0.88 0.80 0.87  Sodium 135 - 145 mmol/L 144 138 140  Potassium 3.5 - 5.1 mmol/L 3.7 5.0 3.6  Chloride 101 - 111 mmol/L 107 104 101  CO2 22 - 32 mmol/L 29 - 27  Calcium 8.9 - 10.3 mg/dL 9.6 - 10.3  Total Protein 6.5 - 8.1 g/dL 6.1(L) - -  Total Bilirubin 0.3 - 1.2 mg/dL 0.6 - -  Alkaline Phos 38 - 126 U/L 88 - -  AST 15 - 41 U/L 16 - -  ALT 17 - 63 U/L <5(L) - -     RADIOGRAPHIC STUDIES: I have personally reviewed the radiological images as listed and agreed with the findings in the report. Ct Head Wo Contrast  07/11/2015  CLINICAL DATA:  Shortness breath and headache. Dizziness and syncope today. No trauma. EXAM: CT HEAD WITHOUT CONTRAST TECHNIQUE: Contiguous axial images were obtained from the base of the skull through the vertex without intravenous contrast. COMPARISON:  None. FINDINGS: Moderate periventricular and diffuse subcortical white matter hypoattenuation is present bilaterally. The basal ganglia are intact. The insular ribbon is normal bilaterally. No focal cortical defects are present. Atherosclerotic calcifications are present in the cavernous internal carotid arteries bilaterally is well is the dural margin of the vertebral arteries. 82 cm hyperdense dural-based mass is associated with the superior sagittal sinus on the left. This is most compatible with a meningioma. No other focal mass lesions are present. Ventricles are  proportionate to the degree of atrophy. No significant  extra-axial fluid collection is present. IMPRESSION: 1. No acute intracranial abnormality. 2. Moderate periventricular and subcortical white matter disease bilaterally. This likely reflects the sequela of chronic microvascular ischemia. 3. 2 cm hyperdense extra-axial dural-based mass lesion near the vertex compatible with a meningioma. Electronically Signed   By: San Morelle M.D.   On: 07/11/2015 18:05   Nm Pulmonary Perf And Vent  07/12/2015  CLINICAL DATA:  Syncope and collapse, shortness of breath, history brain tumor, pulmonary embolism, chronic lymphocytic leukemia, hypertension EXAM: NUCLEAR MEDICINE VENTILATION - PERFUSION LUNG SCAN TECHNIQUE: Ventilation images were obtained in multiple projections using inhaled aerosol Tc-70m DTPA. Perfusion images were obtained in multiple projections after intravenous injection of Tc-89m MAA. RADIOPHARMACEUTICALS:  [id number] millicuries [id number] DTPA aerosol inhalation and 4.2 millicuries [id number] MAA IV COMPARISON:  None Radiographic correlation:  Chest radiograph 07/11/2015 FINDINGS: Ventilation: Central airway deposition of tracer. No focal ventilatory defects. Photopenic defect from pacemaker generator LEFT chest. Perfusion: Absent perfusion in RIGHT upper lobe. Subsegmental perfusion defect RIGHT lower lobe, potentially an additional small subsegmental perfusion defect in RIGHT lower lobe. These areas ventilate normally. Normal perfusion to LEFT lung. Chest radiograph: Enlargement of cardiac silhouette post pacemaker. No gross infiltrates. IMPRESSION: Absent perfusion RIGHT upper lobe with additional subsegmental perfusion defect RIGHT lower lobe with normal ventilation in these areas. Findings represent a high probability for pulmonary embolism. Findings called to Janett Billow RN on [place] on 07/12/2015 at 1431 hours. Electronically Signed   By: Lavonia Dana M.D.   On: 07/12/2015 14:31   Dg Chest  Portable 1 View  07/11/2015  CLINICAL DATA:  Dizziness with headaches and severely shortness of breath off and on for several weeks. EXAM: PORTABLE CHEST 1 VIEW COMPARISON:  None. FINDINGS: 1556 hours. Lordotic patient positioning. The lungs are clear wiithout focal pneumonia, edema, pneumothorax or pleural effusion. Cardiopericardial silhouette is at upper limits of normal for size. The visualized bony structures of the thorax are intact. Left delete permanent pacemaker noted. Telemetry leads overlie the chest. IMPRESSION: No active disease. Electronically Signed   By: Misty Stanley M.D.   On: 07/11/2015 16:12    ASSESSMENT & PLAN:   82 year old African-American gentleman with  #1 right upper lobe and subsegmental right lower lobe perfusion defects concerning for pulmonary embolism. Patient had bilateral pulmonary embolisms and bilateral DVTs diagnosed in November 2016 and has been on Eliquis since then. Has missed about 4-6 doses in the last 2 weeks. Outside imaging results or actual radiographic images not available since these were done at a hospital in [place]. It is unclear if his current VQ scan findings suggest resolving PEs or are in fact new events. It is also unclear if his presenting symptoms are related to his VQ scan findings. Patient notes some chronic lower extremity swelling especially since his bilateral DVTs in November 2016. Feels that his right lower extremity is a little more uncomfortable. #2 bilateral lower extremity swelling and discomfort. Cannot rule out the presence of peripheral arterial disease. Also could have an element of post-thrombotic syndrome. Hospital medicine team planning to get an ultrasound to rule out persistent/new acute DVT. #3 incidental findings of a meningioma - please follow-up with repeat CT in 6 months with primary care physician. We will need neurosurgery consultation if that is increasing in size or causing new symptoms. #4 CLL -  unconfirmed diagnosis. Slightly increased lymphocyte counts. No indication for treatment at this time. No previous history of treatments. Plan.  -Getting his outside CTA scan and [place] LE  venous results from his hospital in [place] would be useful to compare to his current findings and determine if his ventilation perfusion scan suggests resolving clots worse is a new event. -If the imaging comparison suggests a new event or if his increased oxygen requirement or symptoms suggest a change in clinical condition might get on the site of considering this a new event. In this case it might be due to some noncompliance with his Eliquis. -Safest option would be for the patient to switch to Lovenox if his renal function is stable for at least 4 weeks and reassess clot burden and stability at the time with his primary care physician in [place] and potentially switch back to a NoAC if clinically and radiographically stable at that time. -uncertain that his CLL which appears quite quiescent and has never needed treatment is a significant risk factor. -has several acquired risk factors including heart failure, age, relative immobility etc. -If the patient chooses to remain in [place] we would be happy to see him in clinic in 3-4 weeks to reassess his PE/DVT status and considering switching him back to a NOAC.   All of the patients questions were answered to his apparent satisfaction.  Thank you for this interesting consultation.  I spent 60 minutes counseling the patient face to face. The total time spent in the appointment was 80 minutes and more than 50% was on counseling and direct patient cares.    Sullivan Lone MD [place] AAHIVMS [facility] Hematology/Oncology Physician [facility]  (Office):       [phone] (Work cell):  [phone] (Fax):           [phone]  07/14/2015 8:42 AM

## 2015-07-15 LAB — CBC
HCT: 38 % — ABNORMAL LOW (ref 39.0–52.0)
Hemoglobin: 12.6 g/dL — ABNORMAL LOW (ref 13.0–17.0)
MCH: 30.2 pg (ref 26.0–34.0)
MCHC: 33.2 g/dL (ref 30.0–36.0)
MCV: 91.1 fL (ref 78.0–100.0)
Platelets: 177 K/uL (ref 150–400)
RBC: 4.17 MIL/uL — ABNORMAL LOW (ref 4.22–5.81)
RDW: 16.3 % — ABNORMAL HIGH (ref 11.5–15.5)
WBC: 9.4 K/uL (ref 4.0–10.5)

## 2015-07-15 LAB — HEPARIN LEVEL (UNFRACTIONATED): Heparin Unfractionated: 0.45 [IU]/mL (ref 0.30–0.70)

## 2015-07-15 LAB — APTT
aPTT: 55 seconds — ABNORMAL HIGH (ref 24–37)
aPTT: 69 seconds — ABNORMAL HIGH (ref 24–37)

## 2015-07-15 MED ORDER — SODIUM CHLORIDE 0.9 % IV BOLUS (SEPSIS)
250.0000 mL | Freq: Once | INTRAVENOUS | Status: AC
  Administered 2015-07-16: 250 mL via INTRAVENOUS

## 2015-07-15 NOTE — Progress Notes (Signed)
[facility]                                                                              Patient Demographics  Randall Lawrence, is a 82 y.o. male, DOB - [birth year], [id number]  Admit date - 07/11/2015   Admitting Physician Toy Baker, MD  Outpatient Primary MD for the patient is Pcp Not In System  LOS - 1   Chief Complaint  Patient presents with  . Loss of Consciousness  . Shortness of Breath       Brief HPI  Randall Lawrence is a 82 y.o. male with h/o PE, HTN CLL is in [place] visiting family and presented after a syncopal episode. He admitted to dyspnea and dizziness at home. EMS found him to be hypotensive with BP of 90/51.     Assessment & Plan   Principal Problem:  Syncope and collapse/ Acute pulmonary embolism -  Currently on 2 L O2 via nasal cannula, reports not on any O2 outpatient.  VQ is high probability for PE in RUL and RLL - h/o bilateral DVTS in past as well. Doppler ultrasound shows a subacute and age-indeterminate DVT in the femoral and popliteal veins on the right and common femoral, femoral and popliteal veins on the left - have switched Apixaban to Heparin - possible hypercoaguable state due to CLL - ECHO does not reveal a thrombus- troponin negative  - head CT reveals a meningioma and chronic microvascular disease - Discussed in detail with hematology, Dr. Irene Limbo, recommended to obtain medical records from [facility] patient's prior imagings of CT angiogram. If patient has pulmonary embolism in the same areas and then he may continue of apixaban. However if new areas of acute PE are involved, patient will need therapeutic Lovenox and follow up outpatient with hematology - I have requested records from patient's [place] cardiology office in [facility], still waiting  Active Problems: Systolic CHF - EF [id number] %- new finding - will start Hydralazine/ Imdur- allergic to ACE and ARB - legs quite swollen- will start Lasix  daily and follow   Hypertension - cont metoprolol    CLL (chronic lymphocytic leukemia)    Malnutrition of moderate degree - will start supplements  Code Status: fc  Family Communication: Discussed in detail with the patient, all imaging results, lab results explained to the patient   Disposition Plan:   Time Spent in minutes  25 minutes  Procedures  VQ scan  Consults   hematology  DVT Prophylaxis  heparin   Medications  Scheduled Meds: . aspirin EC  81 mg Oral Daily  . folic acid  1 mg Oral Daily  . furosemide  40 mg Intravenous Daily  . guaiFENesin  600 mg Oral BID  . hydrALAZINE  10 mg Oral 3 times per day  . isosorbide mononitrate  30 mg Oral Daily  . lactose free nutrition  237 mL Oral TID BM  . metoprolol tartrate  12.5 mg Oral BID  . pantoprazole  40 mg Oral QHS  . sodium chloride flush  3 mL Intravenous Q12H  . thiamine  100 mg Oral Daily   Continuous Infusions: .  heparin 900 Units/hr (07/15/15 0522)   PRN Meds:.acetaminophen, polyvinyl alcohol, technetium TC 51M diethylenetriame-pentaacetic acid   Antibiotics   Anti-infectives    None        Subjective:   Randall Lawrence was seen and examined today.  No chest pain, no acute issues overnight. No fevers or chills. Shortness of breath is improving. Patient denies dizziness, abdominal pain, N/V/D/C, new weakness, numbess, tingling.   Objective:   Blood pressure 103/71, pulse 81, temperature 97.6 F (36.4 C), temperature source Oral, resp. rate 18, height 5\' 9"  (1.753 m), weight 70.852 kg (156 lb 3.2 oz), SpO2 100 %.  Wt Readings from Last 3 Encounters:  07/15/15 70.852 kg (156 lb 3.2 oz)     Intake/Output Summary (Last 24 hours) at 07/15/15 1241 Last data filed at 07/15/15 1047  Gross per 24 hour  Intake 1030.95 ml  Output    950 ml  Net  80.95 ml    Exam  General: Alert and oriented x 3, NAD  HEENT:  PERRLA, EOMI  Neck: Supple, no JVD, no masses  CVS: S1 S2 clear,  RRR  Respiratory: CTAB  Abdomen: Soft, nontender, nondistended, + bowel sounds  Ext: no cyanosis clubbing, 2+ edema  Neuro: no new deficits  Skin: No rashes  Psych: Normal affect and demeanor, alert and oriented x3    Data Review   Micro Results No results found for this or any previous visit (from the past 240 hour(s)).  Radiology Reports Ct Head Wo Contrast  07/11/2015  CLINICAL DATA:  Shortness breath and headache. Dizziness and syncope today. No trauma. EXAM: CT HEAD WITHOUT CONTRAST TECHNIQUE: Contiguous axial images were obtained from the base of the skull through the vertex without intravenous contrast. COMPARISON:  None. FINDINGS: Moderate periventricular and diffuse subcortical white matter hypoattenuation is present bilaterally. The basal ganglia are intact. The insular ribbon is normal bilaterally. No focal cortical defects are present. Atherosclerotic calcifications are present in the cavernous internal carotid arteries bilaterally is well is the dural margin of the vertebral arteries. 82 cm hyperdense dural-based mass is associated with the superior sagittal sinus on the left. This is most compatible with a meningioma. No other focal mass lesions are present. Ventricles are proportionate to the degree of atrophy. No significant extra-axial fluid collection is present. IMPRESSION: 1. No acute intracranial abnormality. 2. Moderate periventricular and subcortical white matter disease bilaterally. This likely reflects the sequela of chronic microvascular ischemia. 3. 2 cm hyperdense extra-axial dural-based mass lesion near the vertex compatible with a meningioma. Electronically Signed   By: San Morelle M.D.   On: 07/11/2015 18:05   Nm Pulmonary Perf And Vent  07/12/2015  CLINICAL DATA:  Syncope and collapse, shortness of breath, history brain tumor, pulmonary embolism, chronic lymphocytic leukemia, hypertension EXAM: NUCLEAR MEDICINE VENTILATION - PERFUSION LUNG SCAN  TECHNIQUE: Ventilation images were obtained in multiple projections using inhaled aerosol Tc-30m DTPA. Perfusion images were obtained in multiple projections after intravenous injection of Tc-83m MAA. RADIOPHARMACEUTICALS:  99991111 millicuries [id number] DTPA aerosol inhalation and 4.2 millicuries [id number] MAA IV COMPARISON:  None Radiographic correlation:  Chest radiograph 07/11/2015 FINDINGS: Ventilation: Central airway deposition of tracer. No focal ventilatory defects. Photopenic defect from pacemaker generator LEFT chest. Perfusion: Absent perfusion in RIGHT upper lobe. Subsegmental perfusion defect RIGHT lower lobe, potentially an additional small subsegmental perfusion defect in RIGHT lower lobe. These areas ventilate normally. Normal perfusion to LEFT lung. Chest radiograph: Enlargement of cardiac silhouette post pacemaker. No gross infiltrates. IMPRESSION: Absent perfusion RIGHT upper  lobe with additional subsegmental perfusion defect RIGHT lower lobe with normal ventilation in these areas. Findings represent a high probability for pulmonary embolism. Findings called to Janett Billow RN on [place] on 07/12/2015 at 1431 hours. Electronically Signed   By: Lavonia Dana M.D.   On: 07/12/2015 14:31   Dg Chest Portable 1 View  07/11/2015  CLINICAL DATA:  Dizziness with headaches and severely shortness of breath off and on for several weeks. EXAM: PORTABLE CHEST 1 VIEW COMPARISON:  None. FINDINGS: 1556 hours. Lordotic patient positioning. The lungs are clear wiithout focal pneumonia, edema, pneumothorax or pleural effusion. Cardiopericardial silhouette is at upper limits of normal for size. The visualized bony structures of the thorax are intact. Left delete permanent pacemaker noted. Telemetry leads overlie the chest. IMPRESSION: No active disease. Electronically Signed   By: Misty Stanley M.D.   On: 07/11/2015 16:12    CBC  Recent Labs Lab 07/11/15 1550 07/11/15 1631 07/12/15 0327 07/14/15 0420  07/15/15 0359  WBC 8.4  --  9.1 9.2 9.4  HGB 14.1 16.3 13.6 12.8* 12.6*  HCT 43.1 48.0 41.5 40.0 38.0*  PLT 178  --  180 196 177  MCV 90.9  --  91.4 91.7 91.1  MCH 29.7  --  30.0 29.4 30.2  MCHC 32.7  --  32.8 32.0 33.2  RDW 16.3*  --  16.6* 16.3* 16.3*  LYMPHSABS 4.6*  --  5.6*  --   --   MONOABS 0.5  --  0.6  --   --   EOSABS 0.2  --  0.2  --   --   BASOSABS 0.0  --  0.0  --   --     Chemistries   Recent Labs Lab 07/11/15 1550 07/11/15 1631 07/12/15 0327  NA 140 138 144  K 3.6 5.0 3.7  CL 101 104 107  CO2 27  --  29  GLUCOSE 115* 92 119*  BUN 13 19 11   CREATININE 0.87 0.80 0.88  CALCIUM 10.3  --  9.6  MG  --   --  1.8  AST  --   --  16  ALT  --   --  <5*  ALKPHOS  --   --  88  BILITOT  --   --  0.6   ------------------------------------------------------------------------------------------------------------------ estimated creatinine clearance is 58 mL/min (by C-G formula based on Cr of 0.88). ------------------------------------------------------------------------------------------------------------------ No results for input(s): HGBA1C in the last 72 hours. ------------------------------------------------------------------------------------------------------------------ No results for input(s): CHOL, HDL, LDLCALC, TRIG, CHOLHDL, LDLDIRECT in the last 72 hours. ------------------------------------------------------------------------------------------------------------------ No results for input(s): TSH, T4TOTAL, T3FREE, THYROIDAB in the last 72 hours.  Invalid input(s): FREET3 ------------------------------------------------------------------------------------------------------------------ No results for input(s): VITAMINB12, FOLATE, FERRITIN, TIBC, IRON, RETICCTPCT in the last 72 hours.  Coagulation profile No results for input(s): INR, PROTIME in the last 168 hours.  No results for input(s): DDIMER in the last 72 hours.  Cardiac Enzymes  Recent Labs Lab  07/11/15 2123 07/12/15 0327 07/12/15 1159  TROPONINI <0.03 <0.03 <0.03   ------------------------------------------------------------------------------------------------------------------ Invalid input(s): POCBNP   Recent Labs  07/13/15 0605 07/14/15 0545  GLUCAP 89 40     RAI,RIPUDEEP M.D. [facility] 07/15/2015, 12:41 PM  Pager: [phone] Between 7am to 7pm - call Pager - [phone]  After 7pm go to [email or web address] - password [email or web address]  Call night coverage person covering after 7pm

## 2015-07-15 NOTE — Progress Notes (Signed)
Pts. BP 93/60. On call NP, Fredirick Maudlin, made aware via text page.

## 2015-07-15 NOTE — Progress Notes (Signed)
ANTICOAGULATION CONSULT NOTE - Follow Up Consult  Pharmacy Consult for heparin Indication: pulmonary embolus   Labs:  Recent Labs  07/12/15 1159  07/14/15 0420 07/14/15 0915 07/14/15 1656 07/15/15 0359  HGB  --   --  12.8*  --   --  12.6*  HCT  --   --  40.0  --   --  38.0*  PLT  --   --  196  --   --  177  APTT  --   < > 127* 82* 53* 55*  HEPARINUNFRC  --   < > 1.10* 0.88*  --  0.45  TROPONINI <0.03  --   --   --   --   --   < > = values in this interval not displayed.    Assessment: 82yo male remains subtherapeutic on heparin after rate increase, prior PTTs had all been high.  Goal of Therapy:  aPTT 66-102 seconds   Plan:  Will increase heparin gtt by 2 units/kg/hr to 900 units/hr and check PTT in 8hr.  Wynona Neat, PharmD, BCPS  07/15/2015,5:22 AM

## 2015-07-15 NOTE — Clinical Documentation Improvement (Signed)
Internal Medicine  Can the acuity of Systolic CHF be further specified? Please document findings in next progress note NOT in BPA drop down box.    Acuity - Acute, Chronic, Acute on Chronic  Other  Clinically Undetermined  Document any associated diagnoses/conditions  Supporting Information:  Patient being treated with IV Lasix 40 mg daily  Please exercise your independent, professional judgment when responding. A specific answer is not anticipated or expected.  Thank You,  Zoila Shutter RN, BSN, [place] [phone]; Cell: [phone]

## 2015-07-15 NOTE — Progress Notes (Signed)
ANTICOAGULATION CONSULT NOTE - Follow Up Consult  Pharmacy Consult for Heparin Indication: pulmonary embolus  Allergies  Allergen Reactions  . Iodine Hives    Reaction to topical povidine iodine  . Ivp Dye [Iodinated Diagnostic Agents] Hives    [facility] states that [facility] in [facility]  . Lisinopril Hives  . Losartan Hives  . Zocor [Simvastatin] Hives    Patient Measurements: Height: 5\' 9"  (175.3 cm) Weight: 156 lb 3.2 oz (70.852 kg) (scale a) IBW/kg (Calculated) : 70.7 Heparin Dosing Weight: 71 kg  Vital Signs: Temp: 97.6 F (36.4 C) (02/02 1221) Temp Source: Oral (02/02 1221) BP: 103/71 mmHg (02/02 1221) Pulse Rate: 81 (02/02 1221)  Labs:  Recent Labs  07/14/15 0420 07/14/15 0915 07/14/15 1656 07/15/15 0359 07/15/15 1321  HGB 12.8*  --   --  12.6*  --   HCT 40.0  --   --  38.0*  --   PLT 196  --   --  177  --   APTT 127* 82* 53* 55* 69*  HEPARINUNFRC 1.10* 0.88*  --  0.45  --     Estimated Creatinine Clearance: 58 mL/min (by C-G formula based on Cr of 0.88).  Assessment: 44 YOM with hx of PE November 2016, on Eliquis 5mg  BID prior to admission. Admitted 07/11/15 after syncopal episode. VQ scan is high probability for PE in RUL and RLL.   Changed from Eliquis to IV heparin on 1/30 pm. Last Eliquis dose 1/30 am.   aPTTs had been high, and heparin rate was decreased several times before target aPTT was achieved. Then aPTTs trended down again, so drip rate increased.   This afternoon, aPTT is 69 seconds on 900 units/hr.  Therapeutic.   Will continue to use aPTTs as not currently correlating with heparin levels, which are skewed by recent Eliquis doses.    Noted plan to obtain records from [facility] to compare area of prior PE with current images.   Goal of Therapy:  Heparin level 0.3-0.7 units/ml aPTT 66-102 seconds Monitor platelets by anticoagulation protocol: Yes   Plan:    Continue Heparin drip at 900 units/hr.   Daily aPTT and heparin level until  they correlate.   Will follow up anticoag plans;  Eliquis vs therapeutic Lovenox.  Arty Baumgartner, [place] Pager: [phone] 07/15/2015,2:45 PM

## 2015-07-16 DIAGNOSIS — Z0389 Encounter for observation for other suspected diseases and conditions ruled out: Secondary | ICD-10-CM

## 2015-07-16 DIAGNOSIS — Z950 Presence of cardiac pacemaker: Secondary | ICD-10-CM | POA: Diagnosis present

## 2015-07-16 DIAGNOSIS — Z91041 Radiographic dye allergy status: Secondary | ICD-10-CM | POA: Diagnosis present

## 2015-07-16 DIAGNOSIS — IMO0001 Reserved for inherently not codable concepts without codable children: Secondary | ICD-10-CM

## 2015-07-16 DIAGNOSIS — I429 Cardiomyopathy, unspecified: Secondary | ICD-10-CM

## 2015-07-16 DIAGNOSIS — R55 Syncope and collapse: Secondary | ICD-10-CM

## 2015-07-16 DIAGNOSIS — I481 Persistent atrial fibrillation: Secondary | ICD-10-CM

## 2015-07-16 DIAGNOSIS — I4892 Unspecified atrial flutter: Secondary | ICD-10-CM | POA: Diagnosis present

## 2015-07-16 LAB — CBC
HEMATOCRIT: 34.4 % — AB (ref 39.0–52.0)
HEMOGLOBIN: 11.4 g/dL — AB (ref 13.0–17.0)
MCH: 30 pg (ref 26.0–34.0)
MCHC: 33.1 g/dL (ref 30.0–36.0)
MCV: 90.5 fL (ref 78.0–100.0)
Platelets: 164 10*3/uL (ref 150–400)
RBC: 3.8 MIL/uL — AB (ref 4.22–5.81)
RDW: 16.2 % — ABNORMAL HIGH (ref 11.5–15.5)
WBC: 8.8 10*3/uL (ref 4.0–10.5)

## 2015-07-16 LAB — BASIC METABOLIC PANEL
ANION GAP: 13 (ref 5–15)
BUN: 10 mg/dL (ref 6–20)
CHLORIDE: 99 mmol/L — AB (ref 101–111)
CO2: 27 mmol/L (ref 22–32)
Calcium: 10 mg/dL (ref 8.9–10.3)
Creatinine, Ser: 0.87 mg/dL (ref 0.61–1.24)
GFR calc non Af Amer: >60 mL/min (ref >60)
GLUCOSE: 126 mg/dL — AB (ref 65–99)
POTASSIUM: 3.9 mmol/L (ref 3.5–5.1)
Sodium: 139 mmol/L (ref 135–145)

## 2015-07-16 LAB — HEPARIN LEVEL (UNFRACTIONATED): Heparin Unfractionated: 0.72 IU/mL — ABNORMAL HIGH (ref 0.30–0.70)

## 2015-07-16 LAB — APTT: aPTT: 156 seconds — ABNORMAL HIGH (ref 24–37)

## 2015-07-16 MED ORDER — METOPROLOL TARTRATE 12.5 MG HALF TABLET
12.5000 mg | ORAL_TABLET | Freq: Two times a day (BID) | ORAL | Status: DC
  Administered 2015-07-16 – 2015-07-18 (×5): 12.5 mg via ORAL
  Filled 2015-07-16 (×5): qty 1

## 2015-07-16 MED ORDER — ENOXAPARIN SODIUM 100 MG/ML ~~LOC~~ SOLN
100.0000 mg | SUBCUTANEOUS | Status: DC
  Administered 2015-07-16 – 2015-07-18 (×3): 100 mg via SUBCUTANEOUS
  Filled 2015-07-16 (×3): qty 1

## 2015-07-16 MED ORDER — FUROSEMIDE 40 MG PO TABS: 40.0000 mg | ORAL_TABLET | Freq: Every day | ORAL | Status: DC

## 2015-07-16 MED ORDER — ENOXAPARIN (LOVENOX) PATIENT EDUCATION KIT
PACK | Freq: Once | Status: AC
  Administered 2015-07-16: 12:00:00
  Filled 2015-07-16: qty 1

## 2015-07-16 MED ORDER — TRAMADOL HCL 50 MG PO TABS
50.0000 mg | ORAL_TABLET | Freq: Four times a day (QID) | ORAL | Status: DC | PRN
  Administered 2015-07-16 – 2015-07-18 (×4): 50 mg via ORAL
  Filled 2015-07-16 (×4): qty 1

## 2015-07-16 MED ORDER — FUROSEMIDE 40 MG PO TABS: 40.0000 mg | ORAL_TABLET | Freq: Every day | ORAL | Status: DC | PRN

## 2015-07-16 NOTE — Care Management Note (Signed)
Case Management Note  Patient Details  Name: Randall Lawrence MRN: [id number] Date of Birth: Feb 06, 1925  Subjective/Objective:      Admitted with PE              Action/Plan: Patient is here in [place] visiting his daughter from [place]. Patient does not want any HHC services at this time because he does not know how long he will be in [place]. CM informed patient to contact his PCP and he can arrange for [facility] services from the office. Patient stated that he will go home on Lovenox, he has watched the video, his nurse is teaching him how to give it and feels comfortable giving himself injections.   Expected Discharge Date:  Possibly 07/17/2015              Expected Discharge Plan:  Home/Self Care  Discharge planning Services  CM Consult  Choice offered to:  Patient      HH Arranged:  Patient Refused  Status of Service:  In process, will continue to follow  Medicare Important Message Given:  Yes  Sherrilyn Rist [phone] 07/16/2015, 4:26 PM

## 2015-07-16 NOTE — Progress Notes (Signed)
Patient's BP has been running lower than baseline, MD notified and orders given to hold all BP medications except Lasix IV. Will continue to monitor.  Ruben Im RN

## 2015-07-16 NOTE — Evaluation (Signed)
Physical Therapy Evaluation Patient Details Name: Randall Lawrence MRN: [id number] DOB: 10-21-1924 Today's Date: 07/16/2015   History of Present Illness  82 y.o. male with h/o PE, HTN CLL is in [place] visiting family and presented after a syncopal episode. + PE and DVTs.  Clinical Impression  Patient demonstrates deficits in functional mobility as indicated below. Will need continued skilled PT to address deficits and maximize function. Will see as indicated and progress as tolerated.  OF NOTE: Patient with extreme pain in RLE unable to take on weight bearing. Heavy reliance on RW. Concerned regarding ability to navigate stairs, however, patient states that family can carry him upstairs. Patient not receptive to skilled rehab, highly recommend HHPT upon acute discharge and use of RW.    Follow Up Recommendations Home health PT;Supervision for mobility/OOB    Equipment Recommendations  Rolling walker with 5" wheels    Recommendations for Other Services       Precautions / Restrictions Precautions Precautions: Fall Restrictions Weight Bearing Restrictions: No      Mobility  Bed Mobility Overal bed mobility: Needs Assistance Bed Mobility: Supine to Sit     Supine to sit: Min assist     General bed mobility comments: min assist for RLE movement secondary to severe pain  Transfers Overall transfer level: Needs assistance Equipment used: Rolling walker (2 wheeled) Transfers: Sit to/from Stand Sit to Stand: Min assist         General transfer comment: patient with difficulty coming to upright secondary to RLE pain. VCs for hand placement and use of RW. Assist to elevate to standing and for stability  Ambulation/Gait Ambulation/Gait assistance: Min guard Ambulation Distance (Feet): 40 Feet Assistive device: Rolling walker (2 wheeled) Gait Pattern/deviations: Step-to pattern (ambulated on room air)     General Gait Details: patient with severe RLE pain, self  non-weightbearing  Stairs            Wheelchair Mobility    Modified Rankin (Stroke Patients Only)       Balance Overall balance assessment: Needs assistance Sitting-balance support: Bilateral upper extremity supported;Feet supported Sitting balance-Leahy Scale: Fair     Standing balance support: Bilateral upper extremity supported;During functional activity Standing balance-Leahy Scale: Poor Standing balance comment: diffiiculty to maintain balance secondary to LE pain                             Pertinent Vitals/Pain Pain Assessment: 0-10 Pain Score: 8  Pain Location: right LE (knee and ankle) Pain Descriptors / Indicators: Grimacing;Guarding;Sharp Pain Intervention(s): Limited activity within patient's tolerance;Monitored during session;Repositioned;Relaxation    Home Living Family/patient expects to be discharged to:: Private residence Living Arrangements: Spouse/significant other Available Help at Discharge: Family Type of Home: Apartment Home Access: Stairs to enter Entrance Stairs-Rails: Can reach both Entrance Stairs-Number of Steps: 6 Home Layout: One level Home Equipment: Environmental consultant - 2 wheels;Cane - single point;Bedside commode      Prior Function Level of Independence: Independent               Hand Dominance   Dominant Hand: Right    Extremity/Trunk Assessment   Upper Extremity Assessment: Overall WFL for tasks assessed           Lower Extremity Assessment: Generalized weakness;RLE deficits/detail RLE Deficits / Details: RLE DVTs, extremely painful with movement    Cervical / Trunk Assessment: Kyphotic  Communication   Communication: No difficulties  Cognition Arousal/Alertness: Awake/alert Behavior During Therapy:  WFL for tasks assessed/performed Overall Cognitive Status: Within Functional Limits for tasks assessed                      General Comments General comments (skin integrity, edema, etc.): patient  saturations 96% at rest prior to activity on room air, unable to get reading after activity (patients hands very cold, pulse ox would not read)_    Exercises        Assessment/Plan    PT Assessment Patient needs continued PT services  PT Diagnosis Difficulty walking;Abnormality of gait;Generalized weakness;Acute pain   PT Problem List Decreased strength;Decreased range of motion;Decreased activity tolerance;Decreased balance;Decreased mobility;Decreased coordination;Decreased knowledge of use of DME;Cardiopulmonary status limiting activity;Pain  PT Treatment Interventions DME instruction;Gait training;Stair training;Functional mobility training;Therapeutic activities;Therapeutic exercise;Balance training;Patient/family education   PT Goals (Current goals can be found in the Care Plan section) Acute Rehab PT Goals Patient Stated Goal: to go home PT Goal Formulation: With patient Time For Goal Achievement: 07/30/15 Potential to Achieve Goals: Good    Frequency Min 3X/week   Barriers to discharge Inaccessible home environment has stairs to get into his home    Co-evaluation               End of Session Equipment Utilized During Treatment: Gait belt;Oxygen Activity Tolerance: Patient limited by pain Patient left: in chair;with call bell/phone within reach Nurse Communication: Mobility status         Time: [id number] PT Time Calculation (min) (ACUTE ONLY): 17 min   Charges:   PT Evaluation $PT Eval Moderate Complexity: 1 Procedure     PT G CodesDuncan Dull 07/24/2015, 1:44 PM Alben Deeds, [place] DPT  [phone]

## 2015-07-16 NOTE — Progress Notes (Signed)
[facility]                                                                              Patient Demographics  Randall Lawrence, is a 82 y.o. male, DOB - 04-02-1925, [id number]  Admit date - 07/11/2015   Admitting Physician Toy Baker, MD  Outpatient Primary MD for the patient is Pcp Not In System  LOS - 2   Chief Complaint  Patient presents with  . Loss of Consciousness  . Shortness of Breath       Brief HPI  Randall Lawrence is a 83 y.o. male with h/o PE, HTN CLL is in [place] visiting family and presented after a syncopal episode. He admitted to dyspnea and dizziness at home. EMS found him to be hypotensive with BP of 90/51.     Assessment & Plan   Principal Problem:  Syncope and collapse/ Acute pulmonary embolism -  Currently on 2 L O2 via nasal cannula, reports not on any O2 outpatient.  VQ is high probability for PE in RUL and RLL - h/o bilateral DVTS in past as well. Doppler ultrasound shows a subacute and age-indeterminate DVT in the femoral and popliteal veins on the right and common femoral, femoral and popliteal veins on the left - have switched Apixaban to Heparin - possible hypercoaguable state due to CLL - ECHO does not reveal a thrombus- troponin negative  - head CT reveals a meningioma and chronic microvascular disease - Received records from [facility], [place].  03/15/15 Doppler ultrasound had shown totally occluded acute DVT in the right femoral vein, totally occluding acute DVT in the left femoral, popliteal veins. Partially occlusive acute DVT in the left common femoral vein 03/15/15 V/Q scan had shown bilateral upper lobe mismatch defects consistent with pulmonary embolus -Discussed with Dr. Irene Limbo in detail. VQ scan at [facility] showed pulmonary embolism in the right upper lobe and right lower lobe, left lung clear. Duplex showed improving DVT - Dr Irene Limbo recommended subcutaneous Lovenox therapeutic for one month and  follow-up in office. Also recommended cardiology input regarding syncope.    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 aspirin EC  81 mg Oral Daily  . enoxaparin (LOVENOX) injection  100 mg Subcutaneous Q24H  . folic acid  1 mg Oral Daily  . [START ON 07/17/2015] furosemide  40 mg Oral Daily  . guaiFENesin  600 mg Oral BID  . lactose free nutrition  237 mL Oral TID BM  . pantoprazole  40 mg Oral QHS  . sodium chloride flush  3 mL Intravenous Q12H  . thiamine  100 mg Oral Daily   Continuous Infusions:   PRN Meds:.acetaminophen, polyvinyl alcohol, technetium TC 8M diethylenetriame-pentaacetic acid, traMADol   Antibiotics   Anti-infectives    None        Subjective:   Devarus Hubel was seen and examined today.  No complaints, no acute issues overnight. C/o right leg pain.  No fevers or chills.. Patient denies dizziness, abdominal pain, N/V/D/C, new weakness, numbess, tingling.   Objective:   Blood pressure 94/50, pulse 94, temperature 98.1 F (36.7 C), temperature source Oral, resp. rate 18, height 5\' 9"  (1.753 m), weight 70.67 kg (155 lb 12.8 oz), SpO2 94 %.  Wt Readings from Last 3 Encounters:  07/16/15 70.67 kg (155 lb 12.8 oz)      Intake/Output Summary (Last 24 hours) at 07/16/15 1358 Last data filed at 07/16/15 1209  Gross per 24 hour  Intake   1229 ml  Output   1475 ml  Net   -246 ml    Exam  General: Alert and oriented x 3, NAD  HEENT:  PERRLA, EOMI  Neck: Supple, no JVD, no masses  CVS: S1 S2 clear, RRR  Respiratory: CTAB  Abdomen: Soft, NT, ND  Ext: no cyanosis clubbing, 2+ edema  Neuro: no new deficits  Skin: No rashes  Psych: Normal affect and demeanor, alert and oriented x3    Data Review   Micro Results No results found for this or any previous visit (from the past 240 hour(s)).  Radiology Reports Ct Head Wo Contrast  07/11/2015  CLINICAL DATA:  Shortness breath and headache. Dizziness and syncope today. No trauma. EXAM: CT HEAD WITHOUT CONTRAST TECHNIQUE: Contiguous axial images were obtained from the base of the skull through the vertex without intravenous contrast. COMPARISON:  None. FINDINGS: Moderate periventricular and diffuse subcortical white matter hypoattenuation is present bilaterally. The basal ganglia are intact. The insular ribbon is normal bilaterally. No focal cortical defects are present. Atherosclerotic calcifications are present in the cavernous internal carotid arteries bilaterally is well is the dural margin of the vertebral arteries. 82 cm hyperdense dural-based mass is associated with the superior sagittal sinus on the left. This is most compatible with a meningioma. No other focal mass lesions are present. Ventricles are proportionate to the degree of atrophy. No significant extra-axial fluid collection is present. IMPRESSION: 1. No acute intracranial abnormality. 2. Moderate periventricular and subcortical white matter disease bilaterally. This likely reflects the sequela of chronic microvascular ischemia. 3. 2 cm hyperdense extra-axial dural-based mass lesion near the vertex compatible with a meningioma. Electronically Signed   By: San Morelle M.D.   On:  07/11/2015 18:05   Nm Pulmonary Perf And Vent  07/12/2015  CLINICAL DATA:  Syncope and collapse, shortness of breath, history brain tumor, pulmonary embolism, chronic lymphocytic leukemia, hypertension EXAM: NUCLEAR MEDICINE VENTILATION - PERFUSION LUNG SCAN TECHNIQUE: Ventilation images were obtained in multiple projections using inhaled aerosol Tc-58m DTPA. Perfusion images were obtained in multiple projections after intravenous injection of Tc-59m MAA. RADIOPHARMACEUTICALS:  99991111 millicuries [id number] DTPA aerosol inhalation and 4.2 millicuries [id number] MAA IV COMPARISON:  None Radiographic correlation:  Chest radiograph 07/11/2015 FINDINGS: Ventilation: Central airway deposition of tracer. No focal ventilatory defects. Photopenic defect from pacemaker generator LEFT chest. Perfusion: Absent  perfusion in RIGHT upper lobe. Subsegmental perfusion defect RIGHT lower lobe, potentially an additional small subsegmental perfusion defect in RIGHT lower lobe. These areas ventilate normally. Normal perfusion to LEFT lung. Chest radiograph: Enlargement of cardiac silhouette post pacemaker. No gross infiltrates. IMPRESSION: Absent perfusion RIGHT upper lobe with additional subsegmental perfusion defect RIGHT lower lobe with normal ventilation in these areas. Findings represent a high probability for pulmonary embolism. Findings called to Janett Billow RN on [place] on 07/12/2015 at 1431 hours. Electronically Signed   By: Lavonia Dana M.D.   On: 07/12/2015 14:31   Dg Chest Portable 1 View  07/11/2015  CLINICAL DATA:  Dizziness with headaches and severely shortness of breath off and on for several weeks. EXAM: PORTABLE CHEST 1 VIEW COMPARISON:  None. FINDINGS: 1556 hours. Lordotic patient positioning. The lungs are clear wiithout focal pneumonia, edema, pneumothorax or pleural effusion. Cardiopericardial silhouette is at upper limits of normal for size. The visualized bony structures of the thorax are intact. Left  delete permanent pacemaker noted. Telemetry leads overlie the chest. IMPRESSION: No active disease. Electronically Signed   By: Misty Stanley M.D.   On: 07/11/2015 16:12    CBC  Recent Labs Lab 07/11/15 1550 07/11/15 1631 07/12/15 0327 07/14/15 0420 07/15/15 0359 07/16/15 0400  WBC 8.4  --  9.1 9.2 9.4 8.8  HGB 14.1 16.3 13.6 12.8* 12.6* 11.4*  HCT 43.1 48.0 41.5 40.0 38.0* 34.4*  PLT 178  --  180 196 177 164  MCV 90.9  --  91.4 91.7 91.1 90.5  MCH 29.7  --  30.0 29.4 30.2 30.0  MCHC 32.7  --  32.8 32.0 33.2 33.1  RDW 16.3*  --  16.6* 16.3* 16.3* 16.2*  LYMPHSABS 4.6*  --  5.6*  --   --   --   MONOABS 0.5  --  0.6  --   --   --   EOSABS 0.2  --  0.2  --   --   --   BASOSABS 0.0  --  0.0  --   --   --     Chemistries   Recent Labs Lab 07/11/15 1550 07/11/15 1631 07/12/15 0327 07/16/15 1212  NA 140 138 144 139  K 3.6 5.0 3.7 3.9  CL 101 104 107 99*  CO2 27  --  29 27  GLUCOSE 115* 92 119* 126*  BUN 13 19 11 10   CREATININE 0.87 0.80 0.88 0.87  CALCIUM 10.3  --  9.6 10.0  MG  --   --  1.8  --   AST  --   --  16  --   ALT  --   --  <5*  --   ALKPHOS  --   --  88  --   BILITOT  --   --  0.6  --    ------------------------------------------------------------------------------------------------------------------ estimated creatinine clearance is 58.7 mL/min (by C-G formula based on Cr of 0.87). ------------------------------------------------------------------------------------------------------------------ No results for input(s): HGBA1C in the last 72 hours. ------------------------------------------------------------------------------------------------------------------ No results for input(s): CHOL, HDL, LDLCALC, TRIG, CHOLHDL, LDLDIRECT in the last 72 hours. ------------------------------------------------------------------------------------------------------------------ No results for input(s): TSH, T4TOTAL, T3FREE, THYROIDAB in the last 72 hours.  Invalid  input(s): FREET3 ------------------------------------------------------------------------------------------------------------------ No results for input(s): VITAMINB12, FOLATE, FERRITIN, TIBC, IRON, RETICCTPCT in the last 72 hours.  Coagulation profile No results for input(s): INR, PROTIME in the last 168 hours.  No results for input(s): DDIMER in the last 72 hours.  Cardiac Enzymes  Recent Labs Lab 07/11/15 2123  07/12/15 0327 07/12/15 1159  TROPONINI <0.03 <0.03 <0.03   ------------------------------------------------------------------------------------------------------------------ Invalid input(s): POCBNP   Recent Labs  07/14/15 0545  GLUCAP 71     Shefali Ng M.D. [facility] 07/16/2015, 1:58 PM  Pager: [phone] Between 7am to 7pm - call Pager - [phone]  After 7pm go to [email or web address] - password [email or web address]  Call night coverage person covering after 7pm

## 2015-07-16 NOTE — Progress Notes (Signed)
Pt. C/o severe R leg and knee pain 10/10. On call NP, Fredirick Maudlin, for [facility] made aware via text page. RN awaiting orders and will continue to monitor pt. For changes in condition. Jocelyn Nold, Katherine Roan

## 2015-07-16 NOTE — Care Management Important Message (Signed)
Important Message  Patient Details  Name: Randall Lawrence MRN: [id number] Date of Birth: 26-Nov-1924   Medicare Important Message Given:  Yes    Pinkey Mcjunkin P Tranice Laduke 07/16/2015, 3:46 PM

## 2015-07-16 NOTE — Progress Notes (Signed)
Patient education given to patient regarding Lovenox injections.  Patient verbalized and demonstrated understanding of Lovenox injections.  Patient reviewed video.  Will continue to monitor.

## 2015-07-16 NOTE — Consult Note (Signed)
Reason for Consult:   Syncope   Requesting Physician: Dr Tana Coast Primary Cardiologist Dr [facility]  HPI:   Asked to see this 82 y/o AA male for syncope. The pt has a history of remote pacemaker implant in [place] 2010-MDT dual chamber. He says he has had a remote coronary angiogram complicated by IV contrast reaction but had no CAD. He was admitted to the hospital in [facility] in Oct 2016 with syncope. He was found to have PE and DVT. Echo then showed his EF to be 45-50% with RV strain. Syncope was felt to be secondary to PE. He has a history of CLL but apparently this has been quiescent. He was noted to be in A flutter. He was discharged on Eliquis. He was here visiting his daughter when he had a syncope spell 07/11/15 while sitting at the table. He says this was preceded by dyspnea but he denies chest pain or tachycardia. He apparently called out "this is it!" and collapsed. His family says he was out for about 4 minutes. The pt admits to some orthostatic symptoms recently. His B/P initially was 90 systolic. He was not anemic nor did he appear dehydrated by labs. A VQ was done that was remarkable for a new PE-  though his Troponin has been negative. He has been seen by oncology and Eliquis has been changed to Lovenox. His only pre admission cardiac medications were Lopressor 25 mg BID and Eliquis.   PMHx:  Past Medical History  Diagnosis Date  . History of blood clots   . Pulmonary embolism ([place])   . Brain tumor (benign) ([place])   . Leukemia, chronic lymphocytic ([place])   . Hypertension   . Pacemaker     Past Surgical History  Procedure Laterality Date  . Cataracts    . Appendectomy    . Hernia repair      SOCHx:  reports that he has never smoked. He does not have any smokeless tobacco history on file. He reports that he drinks alcohol. He reports that he does not use illicit drugs.  FAMHx: Family History  Problem Relation Age of Onset  . CAD Mother   .  Peripheral vascular disease Mother     ALLERGIES: Allergies  Allergen Reactions  . Iodine Hives    Reaction to topical povidine iodine  . Ivp Dye [Iodinated Diagnostic Agents] Hives    Pt states that [facility] in the past  . Lisinopril Hives  . Losartan Hives  . Zocor [Simvastatin] Hives    ROS: Review of Systems: General: negative for chills, fever, night sweats or weight changes.  Cardiovascular: negative for chest pain, dyspnea on exertion, edema, orthopnea, palpitations, paroxysmal nocturnal dyspnea or shortness of breath HEENT: negative for any visual disturbances, blindness, glaucoma Dermatological: negative for rash Respiratory: negative for cough, hemoptysis, or wheezing Urologic: negative for hematuria or dysuria Abdominal: negative for nausea, vomiting, diarrhea, or hematemesis He had melana in Oct 2016 with unremarkable EGD then Neurologic: negative for visual changes Musculoskeletal: negative for back pain, joint pain, or swelling Psych: cooperative and appropriate All other systems reviewed and are otherwise negative except as noted above.   HOME MEDICATIONS: Prior to Admission medications   Medication Sig Start Date End Date Taking? Authorizing Provider  acetaminophen (TYLENOL) 500 MG tablet Take 1,000 mg by mouth every 6 (six) hours as needed (pain).   Yes Historical Provider, MD  apixaban (  ELIQUIS) 5 MG TABS tablet Take 5 mg by mouth 2 (two) times daily.   Yes Historical Provider, MD  aspirin EC 81 MG tablet Take 81 mg by mouth daily.   Yes Historical Provider, MD  furosemide (LASIX) 40 MG tablet Take 40 mg by mouth daily as needed (ankle swelling).  05/04/15  Yes Historical Provider, MD  metoprolol tartrate (LOPRESSOR) 25 MG tablet Take 12.5 mg by mouth 2 (two) times daily.   Yes Historical Provider, MD  omeprazole (PRILOSEC OTC) 20 MG tablet Take 20 mg by mouth daily.   Yes Historical Provider, MD  polyvinyl alcohol (LIQUIFILM TEARS) 1.4 %  ophthalmic solution Place 1 drop into both eyes 2 (two) times daily as needed for dry eyes.   Yes Historical Provider, MD  Vitamin D, Ergocalciferol, (DRISDOL) 50000 units CAPS capsule Take 50,000 Units by mouth every 7 (seven) days.   Yes Historical Provider, MD    HOSPITAL MEDICATIONS: I have reviewed the patient's current medications.  VITALS: Blood pressure 94/50, pulse 94, temperature 98.1 F (36.7 C), temperature source Oral, resp. rate 18, height 5\' 9"  (1.753 m), weight 155 lb 12.8 oz (70.67 kg), SpO2 94 %.  PHYSICAL EXAM: General appearance: alert, cooperative, no distress and thin Neck: no carotid bruit and no JVD Lungs: clear to auscultation bilaterally Heart: irregularly irregular rhythm and short systolic murmur Abdomen: soft, non-tender; bowel sounds normal; no masses,  no organomegaly Extremities: 1-2+ edema LE, Rt > Lt Pulses: 2+ and symmetric Skin: Skin color, texture, turgor normal. No rashes or lesions Neurologic: Grossly normal  LABS: Results for orders placed or performed during the hospital encounter of 07/11/15 (from the past 24 hour(s))  APTT     Status: Abnormal   Collection Time: 07/15/15  1:21 PM  Result Value Ref Range   aPTT 69 (H) 24 - 37 seconds  Heparin level (unfractionated)     Status: Abnormal   Collection Time: 07/16/15  4:00 AM  Result Value Ref Range   Heparin Unfractionated 0.72 (H) 0.30 - 0.70 IU/mL  CBC     Status: Abnormal   Collection Time: 07/16/15  4:00 AM  Result Value Ref Range   WBC 8.8 4.0 - 10.5 K/uL   RBC 3.80 (L) 4.22 - 5.81 MIL/uL   Hemoglobin 11.4 (L) 13.0 - 17.0 g/dL   HCT 34.4 (L) 39.0 - 52.0 %   MCV 90.5 78.0 - 100.0 fL   MCH 30.0 26.0 - 34.0 pg   MCHC 33.1 30.0 - 36.0 g/dL   RDW 16.2 (H) 11.5 - 15.5 %   Platelets 164 150 - 400 K/uL  APTT     Status: Abnormal   Collection Time: 07/16/15  4:00 AM  Result Value Ref Range   aPTT 156 (H) 24 - 37 seconds    EKG: A flutter, pacing on demand  IMAGING: VQ:  07/12/15 IMPRESSION: Absent perfusion RIGHT upper lobe with additional subsegmental perfusion defect RIGHT lower lobe with normal ventilation in these areas.  Findings represent a high probability for pulmonary embolism.  CXR 07/11/15 PORTABLE CHEST 1 VIEW  COMPARISON: None.  FINDINGS: 1556 hours. Lordotic patient positioning. The lungs are clear wiithout focal pneumonia, edema, pneumothorax or pleural effusion. Cardiopericardial silhouette is at upper limits of normal for size. The visualized bony structures of the thorax are intact. Left delete permanent pacemaker noted. Telemetry leads overlie the chest.  IMPRESSION: No active disease.  Echo: 07/12/15 Study Conclusions  - Left ventricle: LV is diffusely hypokenetic, worse at the apex.  The cavity size was normal. Wall thickness was normal. Systolic function was mildly to moderately reduced. The estimated ejection fraction was in the range of 40% to 45%. - Mitral valve: There was mild regurgitation. - Right ventricle: The cavity size was mildly dilated. Systolic function was mildly reduced. - Right atrium: The atrium was mildly dilated. - Pulmonary arteries: PA peak pressure: 40 mm Hg (S).  Venous dopplers: 2/1/7 Summary:  - Technically difficult to evaluate the thrombus with no previous  evaluation available. - There is no obvious evidence of new acute DVT bilaterally. - There is evidence of subacute and age indeterminate DVT in the  right femoral, and popliteal veins. Also note in age  indeterminate DVT in the left common femoral, femoral, and  popliteal veins. - There is no obvious evidence of superficial thrombosis of the  right and left lower extremity. - No evidence of Baker&'s cyst on the right or left   IMPRESSION: Principal Problem:   Syncope and collapse Active Problems:   Dyspnea   Acute pulmonary embolism (HCC)   Hypertension   CLL (chronic lymphocytic leukemia) (HCC)   Chronic  anticoagulation-Eliquis   Pacemaker   Chronic Atrial flutter (HCC)   Malnutrition of moderate degree   Normal coronary arteries   Allergic to IV contrast   Cardiomyopathy (HCC)   RECOMMENDATION: Check orthostatic B/P. Hard to believe syncope was from new PE with normal Troponin. I reviewed pacer interrogation with Dr Caryl Comes and he suggest this been done again- full interrogation looking for VT. Will discuss further work up with MD. He is currently on Lasix 40 mg- would probably stop this and resume Lopressor at decreased dose.   Time Spent Directly with Patient: 69  Minutes including reviewing OP records and discussion with EP as well as pt interview and exam   Kerin Ransom, [place] beeper 07/16/2015, 12:51 PM   I have personally seen and examined this patient with Kerin Ransom, PA-C. I agree with the assessment and plan as outlined above. Unclear etiology of syncope. Pacemaker interrogation today with no VT. He is in persistent atrial fibrillation. Rate is controlled. LV function mildly depressed. This is likely related to new PE or orthostasis. The plan is for Lovenox per primary team. Would encourage adequate po intake daily. Continue metoprolol at home dose. Only use Lasix prn for worsened LE edema. OK to discharge from cardiac perspective.   MCALHANY,CHRISTOPHER 07/16/2015 3:12 PM

## 2015-07-16 NOTE — Progress Notes (Signed)
ANTICOAGULATION CONSULT NOTE - Follow Up Consult  Pharmacy Consult for Heparin -> Lovenox Indication: pulmonary embolus  Allergies  Allergen Reactions  . Iodine Hives    Reaction to topical povidine iodine  . Ivp Dye [Iodinated Diagnostic Agents] Hives    Pt states that [facility] in the past  . Lisinopril Hives  . Losartan Hives  . Zocor [Simvastatin] Hives    Patient Measurements: Height: 5\' 9"  (175.3 cm) Weight: 155 lb 12.8 oz (70.67 kg) (a scale) IBW/kg (Calculated) : 70.7 Heparin Dosing Weight: 71 kg  Vital Signs: Temp: 98.1 F (36.7 C) (02/03 0914) Temp Source: Oral (02/03 0914) BP: 99/68 mmHg (02/03 0914) Pulse Rate: 84 (02/03 0914)  Labs:  Recent Labs  07/14/15 0420 07/14/15 0915  07/15/15 0359 07/15/15 1321 07/16/15 0400  HGB 12.8*  --   --  12.6*  --  11.4*  HCT 40.0  --   --  38.0*  --  34.4*  PLT 196  --   --  177  --  164  APTT 127* 82*  < > 55* 69* 156*  HEPARINUNFRC 1.10* 0.88*  --  0.45  --  0.72*  < > = values in this interval not displayed.  Estimated Creatinine Clearance: 58 mL/min (by C-G formula based on Cr of 0.88).  Assessment: 57 YOM with hx of PE November 2016, on Eliquis 5mg  BID prior to admission. Admitted 07/11/15 after syncopal episode. VQ scan is high probability for PE in RUL and RLL.   Changed from Eliquis to IV heparin on 1/30 pm. Last Eliquis dose 1/30 am.   Has had multiple heparin rate changes due to variable aPTTs, which had not been correlating with heparin levels due to recent Eliquis doses.   Changing to therapeutic Lovenox today.   CBC trending down some, bmet pending. No bleeding reported.  Goal of Therapy:  Heparin level 0.3-0.7 units/ml aPTT 66-102 seconds  Therapeutic Lovenox Monitor platelets by anticoagulation protocol: Yes   Plan:   Heparin drip stopped at 11am.  Lovenox 100 mg sq q24hrs to begin at 12noon. (~1.5 mg/kg/day)  Patient to learn self-administration.  Continue daily CBC for now.   Will follow up bmet.  Arty Baumgartner, [place] Pager: [phone] 07/16/2015,11:51 AM

## 2015-07-17 ENCOUNTER — Inpatient Hospital Stay (HOSPITAL_COMMUNITY): Payer: Medicare Other

## 2015-07-17 LAB — CBC
HCT: 36 % — ABNORMAL LOW (ref 39.0–52.0)
HEMOGLOBIN: 11.9 g/dL — AB (ref 13.0–17.0)
MCH: 30.5 pg (ref 26.0–34.0)
MCHC: 33.1 g/dL (ref 30.0–36.0)
MCV: 92.3 fL (ref 78.0–100.0)
Platelets: 174 10*3/uL (ref 150–400)
RBC: 3.9 MIL/uL — ABNORMAL LOW (ref 4.22–5.81)
RDW: 16.1 % — AB (ref 11.5–15.5)
WBC: 9 10*3/uL (ref 4.0–10.5)

## 2015-07-17 LAB — SYNOVIAL CELL COUNT + DIFF, W/ CRYSTALS
Lymphocytes-Synovial Fld: 1 % (ref 0–20)
Monocyte-Macrophage-Synovial Fluid: 6 % — ABNORMAL LOW (ref 50–90)
NEUTROPHIL, SYNOVIAL: 93 % — AB (ref 0–25)
WBC, Synovial: 12450 /mm3 — ABNORMAL HIGH (ref 0–200)

## 2015-07-17 LAB — BASIC METABOLIC PANEL
Anion gap: 10 (ref 5–15)
BUN: 8 mg/dL (ref 6–20)
CHLORIDE: 98 mmol/L — AB (ref 101–111)
CO2: 29 mmol/L (ref 22–32)
CREATININE: 0.83 mg/dL (ref 0.61–1.24)
Calcium: 9.9 mg/dL (ref 8.9–10.3)
GFR calc Af Amer: >60 mL/min (ref >60)
GFR calc non Af Amer: >60 mL/min (ref >60)
Glucose, Bld: 104 mg/dL — ABNORMAL HIGH (ref 65–99)
Potassium: 4.5 mmol/L (ref 3.5–5.1)
SODIUM: 137 mmol/L (ref 135–145)

## 2015-07-17 LAB — SEDIMENTATION RATE: SED RATE: 31 mm/h — AB (ref 0–16)

## 2015-07-17 LAB — URIC ACID: Uric Acid, Serum: 6.6 mg/dL (ref 4.4–7.6)

## 2015-07-17 LAB — PROTIME-INR
INR: 1.02 (ref 0.00–1.49)
PROTHROMBIN TIME: 13.6 s (ref 11.6–15.2)

## 2015-07-17 LAB — C-REACTIVE PROTEIN: CRP: 14.3 mg/dL — ABNORMAL HIGH (ref <1.0)

## 2015-07-17 MED ORDER — LIDOCAINE HCL (PF) 1 % IJ SOLN
INTRAMUSCULAR | Status: AC
  Administered 2015-07-17: 15:00:00
  Filled 2015-07-17: qty 5

## 2015-07-17 MED ORDER — METHYLPREDNISOLONE SODIUM SUCC 125 MG IJ SOLR
125.0000 mg | Freq: Once | INTRAMUSCULAR | Status: AC
  Administered 2015-07-17: 125 mg via INTRAVENOUS
  Filled 2015-07-17: qty 2

## 2015-07-17 MED ORDER — DIPHENHYDRAMINE HCL 50 MG/ML IJ SOLN
50.0000 mg | Freq: Once | INTRAMUSCULAR | Status: AC
  Administered 2015-07-17: 50 mg via INTRAVENOUS
  Filled 2015-07-17: qty 1

## 2015-07-17 MED ORDER — LIDOCAINE HCL (PF) 1 % IJ SOLN
INTRAMUSCULAR | Status: AC
  Filled 2015-07-17: qty 10

## 2015-07-17 NOTE — Progress Notes (Signed)
Tech from radiology called nurse to inform her that CT scan will not be able to be completed today for the patient because he was on Heparin on yesterday morning and currently on Lovenox.  Dr. Jeralyn Ruths, the radiologist recommends the CT scan be completed on Monday, July 19, 2015.  MD notified and radiologist's number given to MD for further follow up as needed. Will continue to monitor patient.  Ruben Im RN

## 2015-07-17 NOTE — Progress Notes (Signed)
Kim notified that patient was given medications, Maudie Mercury confirmed that patient will be picked up in an hour.

## 2015-07-17 NOTE — Progress Notes (Signed)
Pt refused to wear TED Hoses, stated they are to tight for his legs and he is unable to tolerate the TED Hoses.

## 2015-07-17 NOTE — Progress Notes (Signed)
[facility]                                                                              Patient Demographics  Randall Lawrence, is a 82 y.o. male, DOB - Oct 01, 1924, CWU:[id number]  Admit date - 07/11/2015   Admitting Physician Toy Baker, MD  Outpatient Primary MD for the patient is Pcp Not In System  LOS - 3   Chief Complaint  Patient presents with  . Loss of Consciousness  . Shortness of Breath       Brief HPI  Randall Lawrence is a 82 y.o. male with h/o PE, HTN CLL is in [place] visiting family and presented after a syncopal episode. He admitted to dyspnea and dizziness at home. EMS found him to be hypotensive with BP of 90/51.     Assessment & Plan   Principal Problem:  Syncope and collapse/ Acute pulmonary embolism -  Currently on 2 L O2 via nasal cannula, reports not on any O2 outpatient.  VQ is high probability for PE in RUL and RLL - h/o bilateral DVTS in past as well. Doppler ultrasound shows a subacute and age-indeterminate DVT in the femoral and popliteal veins on the right and common femoral, femoral and popliteal veins on the left - have switched Apixaban to Heparin - possible hypercoaguable state due to CLL - ECHO does not reveal a thrombus- troponin negative  - head CT reveals a meningioma and chronic microvascular disease - Received records from [facility] [place].  03/15/15 Doppler ultrasound had shown totally occluded acute DVT in the right femoral vein, totally occluding acute DVT in the left femoral, popliteal veins. Partially occlusive acute DVT in the left common femoral vein 03/15/15 V/Q scan had shown bilateral upper lobe mismatch defects consistent with pulmonary embolus -Discussed with Dr. Irene Limbo in detail. VQ scan at [facility] showed pulmonary embolism in the right upper lobe and right lower lobe, left lung clear. Duplex showed improving DVT - Dr Irene Limbo recommended subcutaneous Lovenox therapeutic for one month and  follow-up in office. Also recommended cardiology input regarding syncope.    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 Rolena Infante, recommended MRI however patient cannot have MRI due to pacemaker. Discussed with radiology, Dr. Posey Pronto recommended CT arthrogram of the right knee to rule out meniscal tear and hemarthrosis. Dr. Rolena Infante will evaluate patient after the imaging.    Systolic CHF, syncope - EF 40-45 %- new finding - Hypotensive today, holding hydralazine/ Imdur - allergic to ACE and ARB - pacemaker interrogated 2/3, no VT. recommended as needed Lasix otherwise no cardiac workup needed at this point      Hypertension - cont metoprolol    CLL (chronic lymphocytic leukemia)    Malnutrition of  moderate degree  Code Status:  Full code   Family Communication: Discussed in detail with the patient, all imaging results, lab results explained to the patient   Disposition Plan:   Time Spent in minutes  25 minutes  Procedures  VQ scan LE duplex  Consults   Hematology cardiology  DVT Prophylaxis  heparin   Medications  Scheduled Meds: . aspirin EC  81 mg Oral Daily  . enoxaparin (LOVENOX) injection  100 mg Subcutaneous Q24H  . folic acid  1 mg Oral Daily  . guaiFENesin  600 mg Oral BID  . lactose free nutrition  237 mL Oral TID BM  . metoprolol tartrate  12.5 mg Oral BID  . pantoprazole  40 mg Oral QHS  . sodium chloride flush  3 mL Intravenous Q12H  . thiamine  100 mg Oral Daily   Continuous Infusions:    PRN Meds:.acetaminophen, furosemide, polyvinyl alcohol, technetium TC 58M diethylenetriame-pentaacetic acid, traMADol   Antibiotics   Anti-infectives    None        Subjective:   Randall Lawrence was seen and examined today.  No complaints, no acute issues overnight. C/o right knee pain, swollen, 10/10, unable to bear weight.  No fevers or chills.. Patient denies dizziness, abdominal pain, N/V/D/C, new weakness, numbess, tingling.   Objective:   Blood pressure 120/82, pulse 95, temperature 97.4 F (36.3 C), temperature source Oral, resp. rate 18, height _0  (1.753 m), weight 70.9 kg (156 lb 4.9 oz), SpO2 97 %.  Wt Readings from Last 3 Encounters:  07/17/15 70.9 kg (156 lb 4.9 oz)     Intake/Output Summary (Last 24 hours) at 07/17/15 1131 Last data filed at 07/17/15 0953  Gross per 24 hour  Intake    493 ml  Output    600 ml  Net   -107 ml    Exam  General: Alert and oriented x 3, NAD  HEENT:  PERRLA, EOMI  Neck: Supple, no JVD, no masses  CVS: S1 S2 clear, RRR  Respiratory: CTAB  Abdomen: Soft, NT, ND  Ext: no cyanosis clubbing, 2+ edema, right knee swollen and tender  Neuro: no new deficits  Skin: No rashes  Psych: Normal affect and demeanor, alert and oriented x3    Data Review   Micro Results No results found for this or any previous visit (from the past 240 hour(s)).  Radiology Reports Ct Head Wo Contrast  07/17/2015  CLINICAL DATA:  Pt having posterior headache x 2 days with dizziness. EXAM: CT HEAD WITHOUT CONTRAST TECHNIQUE: Contiguous axial images were obtained from the base of the skull through the vertex without intravenous contrast. COMPARISON:  07/11/2015 FINDINGS: Brain: 2 cm hyperdense left parafalcine extra-axial mass near the vertex, stable. No associated parenchymal edema. Mild parenchymal atrophy. Patchy areas of hypoattenuation in deep and periventricular white matter bilaterally. Negative for acute intracranial hemorrhage, acute  infarction, midline shift, or mass-effect. Acute infarct may be inapparent on noncontrast CT. Ventricles and sulci symmetric. Vascular: No hyperdense vessel. Atherosclerotic and physiologic intracranial calcifications. Skull: Negative for fracture or focal lesion. Sinuses/Orbits: No acute findings. Other: None. IMPRESSION: 1.   1.  Negative for bleed or other acute intracranial process. 2. Atrophy and nonspecific white matter changes. 3. Stable 2 cm left para falcine extra-axial mass probably meningioma. MR with contrast may be useful to confirm. 2. Electronically Signed   By: Lucrezia Europe M.D.   On: 07/17/2015 09:55   Ct Head Wo Contrast  07/11/2015  CLINICAL DATA:  Shortness breath and headache. Dizziness and syncope today. No trauma. EXAM: CT HEAD WITHOUT CONTRAST TECHNIQUE: Contiguous axial images were obtained from the base of the skull through the vertex without intravenous contrast. COMPARISON:  None. FINDINGS: Moderate periventricular and diffuse subcortical white matter hypoattenuation is present bilaterally. The basal ganglia are intact. The insular ribbon is normal bilaterally. No focal cortical defects are present. Atherosclerotic calcifications are present in the cavernous internal carotid arteries bilaterally is well is the dural margin of the vertebral arteries. 82 cm hyperdense dural-based mass is associated with the superior sagittal sinus on the left. This is most compatible with a meningioma. No other focal mass lesions are present. Ventricles are proportionate to the degree of atrophy. No significant extra-axial fluid collection is present. IMPRESSION: 1. No acute intracranial abnormality. 2. Moderate periventricular and subcortical white matter disease bilaterally. This likely reflects the sequela of chronic microvascular ischemia. 3. 2 cm hyperdense extra-axial dural-based mass lesion near the vertex compatible with a meningioma. Electronically Signed   By: San Morelle M.D.   On:  07/11/2015 18:05   Nm Pulmonary Perf And Vent  07/12/2015  CLINICAL DATA:  Syncope and collapse, shortness of breath, history brain tumor, pulmonary embolism, chronic lymphocytic leukemia, hypertension EXAM: NUCLEAR MEDICINE VENTILATION - PERFUSION LUNG SCAN TECHNIQUE: Ventilation images were obtained in multiple projections using inhaled aerosol Tc-64mDTPA. Perfusion images were obtained in multiple projections after intravenous injection of Tc-921mAA. RADIOPHARMACEUTICALS:  3191.5illicuries [id number] aerosol inhalation and 4.2 millicuries [id number] IV COMPARISON:  None Radiographic correlation:  Chest radiograph 07/11/2015 FINDINGS: Ventilation: Central airway deposition of tracer. No focal ventilatory defects. Photopenic defect from pacemaker generator LEFT chest. Perfusion: Absent perfusion in RIGHT upper lobe. Subsegmental perfusion defect RIGHT lower lobe, potentially an additional small subsegmental perfusion defect in RIGHT lower lobe. These areas ventilate normally. Normal perfusion to LEFT lung. Chest radiograph: Enlargement of cardiac silhouette post pacemaker. No gross infiltrates. IMPRESSION: Absent perfusion RIGHT upper lobe with additional subsegmental perfusion defect RIGHT lower lobe with normal ventilation in these areas. Findings represent a high probability for pulmonary embolism. Findings called to JeJanett BillowN on [address] [place] 07/12/2015 at 1431 hours. Electronically Signed   By: MaLavonia Dana.D.   On: 07/12/2015 14:31   Dg Chest Portable 1 View  07/11/2015  CLINICAL DATA:  Dizziness with headaches and severely shortness of breath off and on for several weeks. EXAM: PORTABLE CHEST 1 VIEW COMPARISON:  None. FINDINGS: 1556 hours. Lordotic patient positioning. The lungs are clear wiithout focal pneumonia, edema, pneumothorax or pleural effusion. Cardiopericardial silhouette is at upper limits of normal for size. The visualized bony structures of the thorax are intact. Left  delete permanent pacemaker noted. Telemetry leads overlie the chest. IMPRESSION: No active disease. Electronically Signed   By: ErMisty Stanley.D.   On: 07/11/2015 16:12   Dg Knee Complete 4 Views Right  07/17/2015  CLINICAL DATA:  Acute right knee pain and swelling without known injury. EXAM: RIGHT KNEE - COMPLETE 4+ VIEW COMPARISON:  None. FINDINGS: No fracture or dislocation is noted. Moderate suprapatellar joint effusion is noted. Moderate narrowing of medial joint space is noted, with severe narrowing of the lateral joint space with osteophyte formation. IMPRESSION: Moderate suprapatellar joint effusion. Moderate to severe degenerative joint disease. No fracture or dislocation is noted. Electronically Signed   By: JaMarijo ConceptionM.D.   On: 07/17/2015 09:37    CBC  Recent Labs Lab 07/11/15 1550  07/12/15 0327 07/14/15 0420 07/15/15  0272 07/16/15 0400 07/17/15 0317  WBC 8.4  --  9.1 9.2 9.4 8.8 9.0  HGB 14.1  < > 13.6 12.8* 12.6* 11.4* 11.9*  HCT 43.1  < > 41.5 40.0 38.0* 34.4* 36.0*  PLT 178  --  180 196 177 164 174  MCV 90.9  --  91.4 91.7 91.1 90.5 92.3  MCH 29.7  --  30.0 29.4 30.2 30.0 30.5  MCHC 32.7  --  32.8 32.0 33.2 33.1 33.1  RDW 16.3*  --  16.6* 16.3* 16.3* 16.2* 16.1*  LYMPHSABS 4.6*  --  5.6*  --   --   --   --   MONOABS 0.5  --  0.6  --   --   --   --   EOSABS 0.2  --  0.2  --   --   --   --   BASOSABS 0.0  --  0.0  --   --   --   --   < > = values in this interval not displayed.  Chemistries   Recent Labs Lab 07/11/15 1550 07/11/15 1631 07/12/15 0327 07/16/15 1212 07/17/15 0317  NA 140 138 144 139 137  K 3.6 5.0 3.7 3.9 4.5  CL 101 104 107 99* 98*  CO2 27  --  _0 GLUCOSE 115* 92 119* 126* 104*  BUN _1 CREATININE 0.87 0.80 0.88 0.87 0.83  CALCIUM 10.3  --  9.6 10.0 9.9  MG  --   --  1.8  --   --   AST  --   --  16  --   --   ALT  --   --  <5*  --   --   ALKPHOS  --   --  88  --   --   BILITOT  --   --  0.6  --   --     ------------------------------------------------------------------------------------------------------------------ estimated creatinine clearance is 61.5 mL/min (by C-G formula based on Cr of 0.83). ------------------------------------------------------------------------------------------------------------------ No results for input(s): HGBA1C in the last 72 hours. ------------------------------------------------------------------------------------------------------------------ No results for input(s): CHOL, HDL, LDLCALC, TRIG, CHOLHDL, LDLDIRECT in the last 72 hours. ------------------------------------------------------------------------------------------------------------------ No results for input(s): TSH, T4TOTAL, T3FREE, THYROIDAB in the last 72 hours.  Invalid input(s): FREET3 ------------------------------------------------------------------------------------------------------------------ No results for input(s): VITAMINB12, FOLATE, FERRITIN, TIBC, IRON, RETICCTPCT in the last 72 hours.  Coagulation profile No results for input(s): INR, PROTIME in the last 168 hours.  No results for input(s): DDIMER in the last 72 hours.  Cardiac Enzymes  Recent Labs Lab 07/11/15 2123 07/12/15 0327 07/12/15 1159  TROPONINI <0.03 <0.03 <0.03   ------------------------------------------------------------------------------------------------------------------ Invalid input(s): POCBNP  No results for input(s): GLUCAP in the last 72 hours.   RAI,RIPUDEEP M.D. [facility] 07/17/2015, 11:31 AM  Pager: [phone] Between 7am to 7pm - call Pager - [phone]  After 7pm go to [email or web address] - password [email or web address]  Call night coverage person covering after 7pm

## 2015-07-17 NOTE — Progress Notes (Signed)
Nurse spoke with Randall Lawrence informed nurse to notify her when Solu-medrol and Benadryl has been given to patient.

## 2015-07-17 NOTE — Progress Notes (Signed)
Patient is stable and leaving unit for CT scan; no further follow up at this time.

## 2015-07-17 NOTE — Progress Notes (Signed)
Nurse received call from hospitalist regarding CT Scan, order given to call [place] for coordination of care for CT Scan with contrast because patient has allergy to Dye.

## 2015-07-17 NOTE — Progress Notes (Cosign Needed)
Patient ID: Randall Lawrence, male   DOB: 20-Jul-1924, 82 y.o.   MRN: [id number]    Patient ID: Randall Lawrence MRN: [id number] DOB/AGE: 1925-05-18 82 y.o.  Admit date: 07/11/2015  Admission Diagnoses:  Principal Problem:   Syncope and collapse Active Problems:   Hypertension   CLL (chronic lymphocytic leukemia) (HCC)   Dyspnea   Chronic anticoagulation-Eliquis   Malnutrition of moderate degree   Acute pulmonary embolism (HCC)   Pacemaker   Normal coronary arteries   Allergic to IV contrast   Cardiomyopathy (HCC)   Chronic Atrial flutter (HCC)   HPI: Pleasant 82 year old male pt with a PE.  He has also been diagnosed with a mass on his brain.  He reports severe knee pain and edema.  He says he is unable to bend his knee of walk because of pain.  Past Medical History: Past Medical History  Diagnosis Date  . History of blood clots   . Pulmonary embolism ([place])   . Brain tumor (benign) ([place])   . Leukemia, chronic lymphocytic ([place])   . Hypertension   . Pacemaker     Surgical History: Past Surgical History  Procedure Laterality Date  . Cataracts    . Appendectomy    . Hernia repair      Family History: Family History  Problem Relation Age of Onset  . CAD Mother   . Peripheral vascular disease Mother     Social History: Social History   Social History  . Marital Status: Married    Spouse Name: [name]  . Number of Children: [name]  . Years of Education: [name]   Occupational History  . Not on file.   Social History Main Topics  . Smoking status: Never Smoker   . Smokeless tobacco: Not on file  . Alcohol Use: Yes     Comment: 1 pint bourbon daily (plus )   . Drug Use: No  . Sexual Activity: Not on file   Other Topics Concern  . Not on file   Social History Narrative  . No narrative on file    Allergies: Iodine; Ivp dye; Lisinopril; Losartan; and Zocor  Medications: I have reviewed the patient's current medications.  Vital Signs: Patient Vitals for the  past 24 hrs:  BP Temp Temp src Pulse Resp SpO2 Weight  07/17/15 1200 121/65 mmHg 98.6 F (37 C) Oral 85 18 96 % -  07/17/15 0951 120/82 mmHg 97.4 F (36.3 C) Oral 95 18 97 % -  07/17/15 0455 101/73 mmHg 98 F (36.7 C) Oral 80 18 100 % 70.9 kg (156 lb 4.9 oz)  07/16/15 1959 108/68 mmHg 98.4 F (36.9 C) Oral 80 18 100 % -    Radiology: Ct Head Wo Contrast  07/17/2015  CLINICAL DATA:  Pt having posterior headache x 2 days with dizziness. EXAM: CT HEAD WITHOUT CONTRAST TECHNIQUE: Contiguous axial images were obtained from the base of the skull through the vertex without intravenous contrast. COMPARISON:  07/11/2015 FINDINGS: Brain: 2 cm hyperdense left parafalcine extra-axial mass near the vertex, stable. No associated parenchymal edema. Mild parenchymal atrophy. Patchy areas of hypoattenuation in deep and periventricular white matter bilaterally. Negative for acute intracranial hemorrhage, acute infarction, midline shift, or mass-effect. Acute infarct may be inapparent on noncontrast CT. Ventricles and sulci symmetric. Vascular: No hyperdense vessel. Atherosclerotic and physiologic intracranial calcifications. Skull: Negative for fracture or focal lesion. Sinuses/Orbits: No acute findings. Other: None. IMPRESSION: 1.   1.  Negative for bleed or other acute  intracranial process. 2. Atrophy and nonspecific white matter changes. 3. Stable 2 cm left para falcine extra-axial mass probably meningioma. MR with contrast may be useful to confirm. 2. Electronically Signed   By: Lucrezia Europe M.D.   On: 07/17/2015 09:55   Ct Head Wo Contrast  07/11/2015  CLINICAL DATA:  Shortness breath and headache. Dizziness and syncope today. No trauma. EXAM: CT HEAD WITHOUT CONTRAST TECHNIQUE: Contiguous axial images were obtained from the base of the skull through the vertex without intravenous contrast. COMPARISON:  None. FINDINGS: Moderate periventricular and diffuse subcortical white matter hypoattenuation is present  bilaterally. The basal ganglia are intact. The insular ribbon is normal bilaterally. No focal cortical defects are present. Atherosclerotic calcifications are present in the cavernous internal carotid arteries bilaterally is well is the dural margin of the vertebral arteries. 82 cm hyperdense dural-based mass is associated with the superior sagittal sinus on the left. This is most compatible with a meningioma. No other focal mass lesions are present. Ventricles are proportionate to the degree of atrophy. No significant extra-axial fluid collection is present. IMPRESSION: 1. No acute intracranial abnormality. 2. Moderate periventricular and subcortical white matter disease bilaterally. This likely reflects the sequela of chronic microvascular ischemia. 3. 2 cm hyperdense extra-axial dural-based mass lesion near the vertex compatible with a meningioma. Electronically Signed   By: San Morelle M.D.   On: 07/11/2015 18:05   Nm Pulmonary Perf And Vent  07/12/2015  CLINICAL DATA:  Syncope and collapse, shortness of breath, history brain tumor, pulmonary embolism, chronic lymphocytic leukemia, hypertension EXAM: NUCLEAR MEDICINE VENTILATION - PERFUSION LUNG SCAN TECHNIQUE: Ventilation images were obtained in multiple projections using inhaled aerosol Tc-59m DTPA. Perfusion images were obtained in multiple projections after intravenous injection of Tc-2m MAA. RADIOPHARMACEUTICALS:  99991111 millicuries [id number] DTPA aerosol inhalation and 4.2 millicuries [id number] MAA IV COMPARISON:  None Radiographic correlation:  Chest radiograph 07/11/2015 FINDINGS: Ventilation: Central airway deposition of tracer. No focal ventilatory defects. Photopenic defect from pacemaker generator LEFT chest. Perfusion: Absent perfusion in RIGHT upper lobe. Subsegmental perfusion defect RIGHT lower lobe, potentially an additional small subsegmental perfusion defect in RIGHT lower lobe. These areas ventilate normally. Normal  perfusion to LEFT lung. Chest radiograph: Enlargement of cardiac silhouette post pacemaker. No gross infiltrates. IMPRESSION: Absent perfusion RIGHT upper lobe with additional subsegmental perfusion defect RIGHT lower lobe with normal ventilation in these areas. Findings represent a high probability for pulmonary embolism. Findings called to Janett Billow RN on [place] on 07/12/2015 at 1431 hours. Electronically Signed   By: Lavonia Dana M.D.   On: 07/12/2015 14:31   Dg Chest Portable 1 View  07/11/2015  CLINICAL DATA:  Dizziness with headaches and severely shortness of breath off and on for several weeks. EXAM: PORTABLE CHEST 1 VIEW COMPARISON:  None. FINDINGS: 1556 hours. Lordotic patient positioning. The lungs are clear wiithout focal pneumonia, edema, pneumothorax or pleural effusion. Cardiopericardial silhouette is at upper limits of normal for size. The visualized bony structures of the thorax are intact. Left delete permanent pacemaker noted. Telemetry leads overlie the chest. IMPRESSION: No active disease. Electronically Signed   By: Misty Stanley M.D.   On: 07/11/2015 16:12   Dg Knee Complete 4 Views Right  07/17/2015  CLINICAL DATA:  Acute right knee pain and swelling without known injury. EXAM: RIGHT KNEE - COMPLETE 4+ VIEW COMPARISON:  None. FINDINGS: No fracture or dislocation is noted. Moderate suprapatellar joint effusion is noted. Moderate narrowing of medial joint space is noted, with severe narrowing of  the lateral joint space with osteophyte formation. IMPRESSION: Moderate suprapatellar joint effusion. Moderate to severe degenerative joint disease. No fracture or dislocation is noted. Electronically Signed   By: Marijo Conception, M.D.   On: 07/17/2015 09:37    Labs:  Recent Labs  07/16/15 0400 07/17/15 0317  WBC 8.8 9.0  RBC 3.80* 3.90*  HCT 34.4* 36.0*  PLT 164 174    Recent Labs  07/16/15 1212 07/17/15 0317  NA 139 137  K 3.9 4.5  CL 99* 98*  CO2 27 29  BUN 10 8  CREATININE  0.87 0.83  GLUCOSE 126* 104*  CALCIUM 10.0 9.9    Recent Labs  07/17/15 1345  INR 1.02    Review of Systems: ROS  Physical Exam: Neurologically intact ABD soft  Significant edema noted around right knee TTP  Assessment and Plan: Aspirated knee - 60 cc of synovial fluid removed Sent fluid lab for cytology Wrapped knee in ACE bandage   Ronette Deter, PA-C [place] [phone]

## 2015-07-18 DIAGNOSIS — I483 Typical atrial flutter: Secondary | ICD-10-CM

## 2015-07-18 DIAGNOSIS — M10061 Idiopathic gout, right knee: Secondary | ICD-10-CM

## 2015-07-18 DIAGNOSIS — M109 Gout, unspecified: Secondary | ICD-10-CM | POA: Diagnosis present

## 2015-07-18 LAB — GRAM STAIN

## 2015-07-18 LAB — CBC
HEMATOCRIT: 39.3 % (ref 39.0–52.0)
Hemoglobin: 12.8 g/dL — ABNORMAL LOW (ref 13.0–17.0)
MCH: 30.1 pg (ref 26.0–34.0)
MCHC: 32.6 g/dL (ref 30.0–36.0)
MCV: 92.5 fL (ref 78.0–100.0)
PLATELETS: 221 10*3/uL (ref 150–400)
RBC: 4.25 MIL/uL (ref 4.22–5.81)
RDW: 15.9 % — AB (ref 11.5–15.5)
WBC: 10.7 10*3/uL — ABNORMAL HIGH (ref 4.0–10.5)

## 2015-07-18 MED ORDER — COLCHICINE 0.6 MG PO TABS
1.2000 mg | ORAL_TABLET | Freq: Once | ORAL | Status: AC
  Administered 2015-07-18: 1.2 mg via ORAL
  Filled 2015-07-18: qty 2

## 2015-07-18 MED ORDER — COLCHICINE 0.6 MG PO TABS: 0.6000 mg | ORAL_TABLET | Freq: Every day | ORAL | Status: DC

## 2015-07-18 MED ORDER — TRAMADOL HCL 50 MG PO TABS: 50.0000 mg | ORAL_TABLET | Freq: Four times a day (QID) | ORAL | Status: DC | PRN

## 2015-07-18 MED ORDER — INDOMETHACIN 50 MG PO CAPS
50.0000 mg | ORAL_CAPSULE | Freq: Two times a day (BID) | ORAL | Status: DC
  Administered 2015-07-18: 50 mg via ORAL
  Filled 2015-07-18 (×3): qty 1

## 2015-07-18 MED ORDER — INDOMETHACIN 25 MG PO CAPS: 50.0000 mg | ORAL_CAPSULE | Freq: Two times a day (BID) | ORAL | Status: DC

## 2015-07-18 MED ORDER — ENOXAPARIN SODIUM 100 MG/ML ~~LOC~~ SOLN: 100.0000 mg | SUBCUTANEOUS | Status: DC

## 2015-07-18 MED ORDER — INDOMETHACIN 50 MG PO CAPS
50.0000 mg | ORAL_CAPSULE | Freq: Two times a day (BID) | ORAL | Status: DC
  Filled 2015-07-18 (×3): qty 1

## 2015-07-18 NOTE — Discharge Summary (Addendum)
Physician Discharge Summary   Patient ID: Randall Lawrence MRN: [id number] DOB/AGE: May 07, 1925 82 y.o.  Admit date: 07/11/2015 Discharge date: 07/18/2015  Primary Care Physician:  Pcp Not In System  Discharge Diagnoses:     . Syncope and collapse Bilateral DVT-subacute Acute Pulmonary embolism, right lung Acute gout right knee . Hypertension . CLL (chronic lymphocytic leukemia) ([place]) . Dyspnea . Malnutrition of moderate degree . Pacemaker . Allergic to IV contrast . Chronic Atrial [facility])   Consults:   Hematology, Dr Irene Limbo Cardiology, Dr. Julianne Handler Orthopedics, Dr Doran Durand  Recommendations for Outpatient Follow-up:  1. Home health to be arranged by case management 2. Patient wanted to continue Lovenox therapeutic and follow-up with Dr. Irene Limbo   DIET: Heart healthy diet    Allergies:   Allergies  Allergen Reactions  . Iodine Hives    Reaction to topical povidine iodine  . Ivp Dye [Iodinated Diagnostic Agents] Hives    [facility]  . Lisinopril Hives  . Losartan Hives  . Zocor [Simvastatin] Hives     DISCHARGE MEDICATIONS: Current Discharge Medication List    START taking these medications   Details  colchicine 0.6 MG tablet Take 1 tablet (0.6 mg total) by mouth daily. Qty: 30 tablet, Refills: 3    enoxaparin (LOVENOX) 100 MG/ML injection Inject 1 mL (100 mg total) into the skin daily. Qty: 30 Syringe, Refills: 1    indomethacin (INDOCIN) 25 MG capsule Take 2 capsules (50 mg total) by mouth 2 (two) times daily with a meal. Take for twice a day for 1 week with food, then as needed for gout attack/knee pain Qty: 60 capsule, Refills: 3    traMADol (ULTRAM) 50 MG tablet Take 1 tablet (50 mg total) by mouth every 6 (six) hours as needed for moderate pain. Qty: 60 tablet, Refills: 0      CONTINUE these medications which have NOT CHANGED   Details  acetaminophen (TYLENOL) 500 MG tablet Take 1,000 mg by mouth every 6  (six) hours as needed (pain).    aspirin EC 81 MG tablet Take 81 mg by mouth daily.    furosemide (LASIX) 40 MG tablet Take 40 mg by mouth daily as needed (ankle swelling).     metoprolol tartrate (LOPRESSOR) 25 MG tablet Take 12.5 mg by mouth 2 (two) times daily.    omeprazole (PRILOSEC OTC) 20 MG tablet Take 20 mg by mouth daily.    polyvinyl alcohol (LIQUIFILM TEARS) 1.4 % ophthalmic solution Place 1 drop into both eyes 2 (two) times daily as needed for dry eyes.    Vitamin D, Ergocalciferol, (DRISDOL) 50000 units CAPS capsule Take 50,000 Units by mouth every 7 (seven) days.      STOP taking these medications     apixaban (ELIQUIS) 5 MG TABS tablet          Brief H and P: For complete details please refer to admission H and P, but in brief DAMONI CAUSBY is a 82 y.o. male with h/o PE, HTN CLL is in [place] visiting family and presented after a syncopal episode. He admitted to dyspnea and dizziness at home. EMS found him to be hypotensive with BP of 90/51.  Hospital Course:  Syncope and collapse/ Acute pulmonary embolism - Currently on 2 L O2 via nasal cannula, reports not on any O2 outpatient. VQ is high probability for PE in RUL and RLL - h/o bilateral DVTS in past as well. Doppler ultrasound shows  a subacute and age-indeterminate DVT in the femoral and popliteal veins on the right and common femoral, femoral and popliteal veins on the left - ECHO didnot reveal a thrombus- troponin negative  - head CT reveals a meningioma and chronic microvascular disease - Received records from [facility], [place].  03/15/15 Doppler ultrasound had shown totally occluded acute DVT in the right femoral vein, totally occluding acute DVT in the left femoral, popliteal veins. Partially occlusive acute DVT in the left common femoral vein 03/15/15 V/Q scan had shown bilateral upper lobe mismatch defects consistent with pulmonary embolus - VQ scan at [facility] showed pulmonary  embolism in the right upper lobe and right lower lobe, left lung clear. Duplex showed improving DVT - Dr Irene Limbo recommended subcutaneous Lovenox therapeutic for one month and follow-up in office, scheduled on 08/02/15. Eliquis is discontinued.     Right knee pain and effusion due to acute gout - Patient was complaining of unable to bear weight on his right knee, on examination, significantly swollen, tender - X-ray showed moderate suprapatellar joint effusion with moderate to severe degenerative joint disease - ESR, CRP elevated, uric acid normal - Right knee was aspirated by orthopedics, 60 mL of synovial fluid was removed which was consistent with acute gout - Patient was placed on tramadol for pain, indomethacin and colchicine as recommended by Dr. Doran Durand   Systolic CHF, syncope - EF 40-45 %- new finding -  allergic to ACE and ARB. Patient was seen by cardiology and recommended no cardiac workup at this time. Pacemaker was interrogated on 07/16/15 and patient had no VT episodes  - continue metoprolol   Hypertension - cont metoprolol    CLL (chronic lymphocytic leukemia)    Malnutrition of moderate degree   Day of Discharge BP 136/82 mmHg  Pulse 86  Temp(Src) 97.6 F (36.4 C) (Oral)  Resp 18  Ht 5' 9" (1.753 m)  Wt 71.079 kg (156 lb 11.2 oz)  BMI 23.13 kg/m2  SpO2 98%  Physical Exam: General: Alert and awake oriented x3 not in any acute distress. HEENT: anicteric sclera, pupils reactive to light and accommodation CVS: S1-S2 clear no murmur rubs or gallops Chest: clear to auscultation bilaterally, no wheezing rales or rhonchi Abdomen: soft nontender, nondistended, normal bowel sounds Extremities: no cyanosis, clubbing or edema noted bilaterally Neuro: Cranial nerves II-XII intact, no focal neurological deficits   The results of significant diagnostics from this hospitalization (including imaging, microbiology, ancillary and laboratory) are listed below for reference.     LAB RESULTS: Basic Metabolic Panel:  Recent Labs Lab 07/12/15 0327 07/16/15 1212 07/17/15 0317  NA 144 139 137  K 3.7 3.9 4.5  CL 107 99* 98*  CO2 _0 GLUCOSE 119* 126* 104*  BUN _1 CREATININE 0.88 0.87 0.83  CALCIUM 9.6 10.0 9.9  MG 1.8  --   --   PHOS 3.4  --   --    Liver Function Tests:  Recent Labs Lab 07/12/15 0327  AST 16  ALT <5*  ALKPHOS 88  BILITOT 0.6  PROT 6.1*  ALBUMIN 3.5   No results for input(s): LIPASE, AMYLASE in the last 168 hours. No results for input(s): AMMONIA in the last 168 hours. CBC:  Recent Labs Lab 07/12/15 0327  07/17/15 0317 07/18/15 0252  WBC 9.1  < > 9.0 10.7*  NEUTROABS 2.7  --   --   --   HGB 13.6  < > 11.9* 12.8*  HCT 41.5  < >  36.0* 39.3  MCV 91.4  < > 92.3 92.5  PLT 180  < > 174 221  < > = values in this interval not displayed. Cardiac Enzymes:  Recent Labs Lab 07/12/15 0327 07/12/15 1159  TROPONINI <0.03 <0.03   BNP: Invalid input(s): POCBNP CBG:  Recent Labs Lab 07/13/15 0605 07/14/15 0545  GLUCAP 89 86    Significant Diagnostic Studies:  Ct Head Wo Contrast  07/11/2015  CLINICAL DATA:  Shortness breath and headache. Dizziness and syncope today. No trauma. EXAM: CT HEAD WITHOUT CONTRAST TECHNIQUE: Contiguous axial images were obtained from the base of the skull through the vertex without intravenous contrast. COMPARISON:  None. FINDINGS: Moderate periventricular and diffuse subcortical white matter hypoattenuation is present bilaterally. The basal ganglia are intact. The insular ribbon is normal bilaterally. No focal cortical defects are present. Atherosclerotic calcifications are present in the cavernous internal carotid arteries bilaterally is well is the dural margin of the vertebral arteries. 82 cm hyperdense dural-based mass is associated with the superior sagittal sinus on the left. This is most compatible with a meningioma. No other focal mass lesions are present. Ventricles are  proportionate to the degree of atrophy. No significant extra-axial fluid collection is present. IMPRESSION: 1. No acute intracranial abnormality. 2. Moderate periventricular and subcortical white matter disease bilaterally. This likely reflects the sequela of chronic microvascular ischemia. 3. 2 cm hyperdense extra-axial dural-based mass lesion near the vertex compatible with a meningioma. Electronically Signed   By: San Morelle M.D.   On: 07/11/2015 18:05   Nm Pulmonary Perf And Vent  07/12/2015  CLINICAL DATA:  Syncope and collapse, shortness of breath, history brain tumor, pulmonary embolism, chronic lymphocytic leukemia, hypertension EXAM: NUCLEAR MEDICINE VENTILATION - PERFUSION LUNG SCAN TECHNIQUE: Ventilation images were obtained in multiple projections using inhaled aerosol Tc-58mDTPA. Perfusion images were obtained in multiple projections after intravenous injection of Tc-926mAA. RADIOPHARMACEUTICALS:  3169.6illicuries [id number] aerosol inhalation and 4.2 millicuries [id number] IV COMPARISON:  None Radiographic correlation:  Chest radiograph 07/11/2015 FINDINGS: Ventilation: Central airway deposition of tracer. No focal ventilatory defects. Photopenic defect from pacemaker generator LEFT chest. Perfusion: Absent perfusion in RIGHT upper lobe. Subsegmental perfusion defect RIGHT lower lobe, potentially an additional small subsegmental perfusion defect in RIGHT lower lobe. These areas ventilate normally. Normal perfusion to LEFT lung. Chest radiograph: Enlargement of cardiac silhouette post pacemaker. No gross infiltrates. IMPRESSION: Absent perfusion RIGHT upper lobe with additional subsegmental perfusion defect RIGHT lower lobe with normal ventilation in these areas. Findings represent a high probability for pulmonary embolism. Findings called to JeJanett BillowN on [address] [place] 07/12/2015 at 1431 hours. Electronically Signed   By: MaLavonia Dana.D.   On: 07/12/2015 14:31   Dg Chest  Portable 1 View  07/11/2015  CLINICAL DATA:  Dizziness with headaches and severely shortness of breath off and on for several weeks. EXAM: PORTABLE CHEST 1 VIEW COMPARISON:  None. FINDINGS: 1556 hours. Lordotic patient positioning. The lungs are clear wiithout focal pneumonia, edema, pneumothorax or pleural effusion. Cardiopericardial silhouette is at upper limits of normal for size. The visualized bony structures of the thorax are intact. Left delete permanent pacemaker noted. Telemetry leads overlie the chest. IMPRESSION: No active disease. Electronically Signed   By: ErMisty Stanley.D.   On: 07/11/2015 16:12    2D ECHO: Study Conclusions  - Left ventricle: LV is diffusely hypokenetic, worse at the apex. The cavity size was normal. Wall thickness was normal. Systolic function was mildly to moderately reduced.  The estimated ejection fraction was in the range of 40% to 45%. - Mitral valve: There was mild regurgitation. - Right ventricle: The cavity size was mildly dilated. Systolic function was mildly reduced. - Right atrium: The atrium was mildly dilated. - Pulmonary arteries: PA peak pressure: 40 mm Hg (S).  Disposition and Follow-up: Discharge Instructions    Diet - low sodium heart healthy    Complete by:  As directed      Discharge instructions    Complete by:  As directed   Please take Indomethacin 54m twice a day with food for a week then as needed for knee pain/gout attack. You are placed on colchicine for gout /knee. You can stop it if you have any diarrhea or change to as needed after 1 week. Please continue omeprazole daily while you are taking indomethacin. You can increase it to twice a day if you are having acid reflux.     Increase activity slowly    Complete by:  As directed             DISPOSITION: Home    DISCHARGE FOLLOW-UP Follow-up Information    Follow up with GSullivan Lone MD On 08/02/2015.   Specialties:  Hematology, Oncology   Why:  at 10:30 AM,  for hospital follow-up   Contact information:   [address] [id number]      Follow up with MLauree Chandler MD. Schedule an appointment as soon as possible for a visit in 2 weeks.   Specialty:  Cardiology   Why:  for hospital follow-up (cardiology)   Contact information:   [address] [place] [phone]       Follow up with HWylene Simmer MD. Schedule an appointment as soon as possible for a visit in 2 weeks.   Specialty:  Orthopedic Surgery   Why:  for hospital follow-up/ knee   Contact information:   [address] [phone]       Time spent on Discharge: 35 minutes   signed:   [name] Alomar M.D. [facility] 07/18/2015, 10:29 AM Pager: [phone]  Coding Query  Patient had acute systolic CHF, EF 424-58%   Anush Wiedeman M.D. [facility] 07/20/2015, 6:25 PM  Pager: [phone]

## 2015-07-18 NOTE — Progress Notes (Signed)
CM received call from RN to please arrange for Rolling walker.  Pt refuses all HH services; CM called [facility] DME rep, Germaine to please deliver the rolling walker so pt can discharge.  No other CM needs were communicated.

## 2015-07-18 NOTE — Progress Notes (Signed)
Physical Therapy Treatment Patient Details Name: Randall Lawrence MRN: [id number] DOB: Nov 29, 1924 Today's Date: 07/18/2015    History of Present Illness 82 y.o. male with h/o PE, HTN CLL is in [place] visiting family and presented after a syncopal episode. + PE and DVTs.    PT Comments    Great progress today. Moderate reliance on RW for support but stable knee without buckling. Safely navigated stairs after education and reports he feels confident with this task. Adequate for d/c from PT standpoint.  Follow Up Recommendations  Home health PT;Supervision for mobility/OOB     Equipment Recommendations  Rolling walker with 5" wheels    Recommendations for Other Services       Precautions / Restrictions Precautions Precautions: Fall Restrictions Weight Bearing Restrictions: No    Mobility  Bed Mobility               General bed mobility comments: sitting in recliner  Transfers Overall transfer level: Needs assistance Equipment used: Rolling walker (2 wheeled) Transfers: Sit to/from Stand Sit to Stand: Supervision         General transfer comment: supervision for safety. good hand placement. Slow to rise but stable with RW for support.  Ambulation/Gait Ambulation/Gait assistance: Supervision Ambulation Distance (Feet): 175 Feet Assistive device: Rolling walker (2 wheeled) Gait Pattern/deviations: Step-through pattern;Decreased stance time - right;Decreased stride length;Trunk flexed;Antalgic Gait velocity: decreased Gait velocity interpretation: Below normal speed for age/gender General Gait Details: Cues for upright posture, good control of RW. Moderate reliance on RW for support, moderately antalgic with Rt flexed knee throughout gait cycle but without buckling.   Stairs Stairs: Yes Stairs assistance: Supervision Stair Management: One rail Right;Step to pattern;Sideways Number of Stairs: 6 General stair comments: Demonstrated to pt prior to practicing. VC  for technique and sequencing. Used sideways approach with both hands on single rail for support. No loss of balance and reports he feels confident with this task.  Wheelchair Mobility    Modified Rankin (Stroke Patients Only)       Balance                                    Cognition Arousal/Alertness: Awake/alert Behavior During Therapy: WFL for tasks assessed/performed Overall Cognitive Status: Within Functional Limits for tasks assessed                      Exercises      General Comments        Pertinent Vitals/Pain Pain Assessment: Faces Faces Pain Scale: Hurts a little bit Pain Location: Rt knee Pain Descriptors / Indicators: Sore Pain Intervention(s): Monitored during session;Repositioned    Home Living                      Prior Function            PT Goals (current goals can now be found in the care plan section) Acute Rehab PT Goals Patient Stated Goal: to go home PT Goal Formulation: With patient Time For Goal Achievement: 07/30/15 Potential to Achieve Goals: Good Progress towards PT goals: Progressing toward goals    Frequency  Min 3X/week    PT Plan Current plan remains appropriate    Co-evaluation             End of Session Equipment Utilized During Treatment: Gait belt Activity Tolerance: Patient tolerated treatment well Patient left: in  chair;with call bell/phone within reach     Time: 1142-1157 PT Time Calculation (min) (ACUTE ONLY): 15 min  Charges:  $Gait Training: 8-22 mins                    G Codes:      Ellouise Newer 29-Jul-2015, 12:51 PM Camille Bal [place], [place]

## 2015-07-18 NOTE — Progress Notes (Signed)
Pt has orders to be discharged. Discharge instructions given and pt has no additional questions at this time. Medication regimen reviewed and pt educated. Pt verbalized understanding and has no additional questions. Telemetry box removed. IV removed and site in good condition. Lovenox injection instructions given. Pt verbalized understanding. Pt stable and waiting for transportation.   Maurene Capes RN

## 2015-07-18 NOTE — Progress Notes (Signed)
Subjective: Pt reports decreased pain in right knee.  He says he can walk comfortably and wants to go home.  He denies f/c/n/v.  Tolerating regular diet.  Knee aspirated yesterday.   Objective: Vital signs in last 24 hours: Temp:  [97.6 F (36.4 C)-98.6 F (37 C)] 97.6 F (36.4 C) (02/05 0555) Pulse Rate:  [85-92] 86 (02/05 0555) Resp:  [18] 18 (02/05 0555) BP: (112-136)/(65-82) 136/82 mmHg (02/05 0555) SpO2:  [96 %-100 %] 98 % (02/05 0555) Weight:  [71.079 kg (156 lb 11.2 oz)] 71.079 kg (156 lb 11.2 oz) (02/05 0555)  Intake/Output from previous day: 02/04 0701 - 02/05 0700 In: 533 [P.O.:530; I.V.:3] Out: 1000 [Urine:1000] Intake/Output this shift:     Recent Labs  07/16/15 0400 07/17/15 0317 07/18/15 0252  HGB 11.4* 11.9* 12.8*    Recent Labs  07/17/15 0317 07/18/15 0252  WBC 9.0 10.7*  RBC 3.90* 4.25  HCT 36.0* 39.3  PLT 174 221    Recent Labs  07/16/15 1212 07/17/15 0317  NA 139 137  K 3.9 4.5  CL 99* 98*  CO2 27 29  BUN 10 8  CREATININE 0.87 0.83  GLUCOSE 126* 104*  CALCIUM 10.0 9.9    Recent Labs  07/17/15 1345  INR 1.02   Aspiration of th knee reveals gout.  PE:  wn wd male in nad.  R knee with small effusion.  No warmth, erythema or tenderness.  5/5 strength at the quad.  skin healthy and intact.  Assessment/Plan: R knee gout - ok to d/c home on colchicine.  Pt should f/u with his PCP upon return home.  Ortho signing off.   Aviraj Kentner 07/18/2015, 10:02 AM

## 2015-07-20 NOTE — Progress Notes (Signed)
Cardiology Office Note:    Date:  07/21/2015   ID:  [name], DOB 1925-05-29, MRN [id number]  PCP:  Pcp Not In System  Cardiologist:  Dr Alice Reichert [facility] and the [place]:  Dr. Lauree Chandler saw in [place] 2/17  Electrophysiologist:  n/a  Chief Complaint  Patient presents with  . Hospitalization Follow-up    acute PE, syncope  . Cardiomyopathy  . Atrial Fibrillation     History of Present Illness:     [place] is a 82 y.o. male with a hx of prior pulmonary embolism, HTN, CLL, atrial flutter status post dual-chamber pacemaker implantation in 2010.  Records indicate reported history of cardiac catheterization without significant CAD. He apparently had IV contrast reaction. He was admitted to the hospital in [place] with syncope and found to have PE and DVT. Echo at that time demonstrated EF 45-50% with RV strain. He was placed on L a close for anticoagulation secondary to atrial flutter. Patient was recently visiting family here (originally from [place]).   Admitted 1/29-2/5 with syncope in the setting of recurrent pulmonary embolism in the right upper lobe and right lower lobe as well as subacute and age-indeterminate DVT in the femoral and popliteal veins on the right and common, femoral and popliteal veins on the left. Records from [facility] in [place] from 10/16 with duplex that demonstrated totally occluded acute DVT in the right femoral vein, totally occluding acute DVT in the left femoral, popliteal veins and partially occlusive acute DVT in the left common femoral vein. VQ scan demonstrated bilateral upper lobe mismatch defects consistent with pulmonary embolus. Patient's Eliquis was discontinued. He was placed on subcutaneous Lovenox. Plan is to continue this for one month and follow-up as an outpatient (08/02/15 with Dr. Irene Limbo - Oncology). He was evaluated by cardiology (Dr. Angelena Form). Pacemaker was  interrogated. There were no ventricular arrhythmias identified. He remained in persistent atrial fibrillation. Echocardiogram demonstrated EF 40-45% (similar to prior echo in [place]). When necessary Lasix was recommended for worsening LE edema. Other findings during hospital stay included gout in his knee as well as meningioma on CT. Follow-up CT with primary care was recommended in 6 months.  Returns for FU.     Past Medical History  Diagnosis Date  . History of blood clots   . Pulmonary embolism ([place])   . Brain tumor (benign) ([place])   . Leukemia, chronic lymphocytic ([place])   . Hypertension   . Pacemaker     Past Surgical History  Procedure Laterality Date  . Cataracts    . Appendectomy    . Hernia repair      Current Medications: Outpatient Prescriptions Prior to Visit  Medication Sig Dispense Refill  . acetaminophen (TYLENOL) 500 MG tablet Take 1,000 mg by mouth every 6 (six) hours as needed (pain).    Marland Kitchen aspirin EC 81 MG tablet Take 81 mg by mouth daily.    . colchicine 0.6 MG tablet Take 1 tablet (0.6 mg total) by mouth daily. 30 tablet 3  . enoxaparin (LOVENOX) 100 MG/ML injection Inject 1 mL (100 mg total) into the skin daily. 30 Syringe 1  . furosemide (LASIX) 40 MG tablet Take 40 mg by mouth daily as needed (ankle swelling).     . indomethacin (INDOCIN) 25 MG capsule Take 2 capsules (50 mg total) by mouth 2 (two) times daily with a meal. Take for twice a day for 1  week with food, then as needed for gout attack/knee pain 60 capsule 3  . metoprolol tartrate (LOPRESSOR) 25 MG tablet Take 12.5 mg by mouth 2 (two) times daily.    Marland Kitchen omeprazole (PRILOSEC OTC) 20 MG tablet Take 20 mg by mouth daily.    . polyvinyl alcohol (LIQUIFILM TEARS) 1.4 % ophthalmic solution Place 1 drop into both eyes 2 (two) times daily as needed for dry eyes.    . traMADol (ULTRAM) 50 MG tablet Take 1 tablet (50 mg total) by mouth every 6 (six) hours as needed for moderate pain. 60 tablet 0  . Vitamin D,  Ergocalciferol, (DRISDOL) 50000 units CAPS capsule Take 50,000 Units by mouth every 7 (seven) days.     No facility-administered medications prior to visit.     Allergies:   Iodine; Ivp dye; Lisinopril; Losartan; and Zocor   Social History   Social History  . Marital Status: Married    Spouse Name: [name]  . Number of Children: [name]  . Years of Education: [name]   Social History Main Topics  . Smoking status: Never Smoker   . Smokeless tobacco: Not on file  . Alcohol Use: Yes     Comment: 1 pint bourbon daily (plus )   . Drug Use: No  . Sexual Activity: Not on file   Other Topics Concern  . Not on file   Social History Narrative  . No narrative on file     Family History:  The patient's family history includes CAD in his mother; Peripheral vascular disease in his mother.   ROS:   Please see the history of present illness.    ROS All other systems reviewed and are negative.   Physical Exam:    VS:  There were no vitals taken for this visit.   GEN: Well nourished, well developed, in no acute distress HEENT: normal Neck: no JVD, no masses Cardiac: Normal S1/S2, RRR; no murmurs, rubs, or gallops, no edema;   carotid bruits,   Respiratory:  clear to auscultation bilaterally; no wheezing, rhonchi or rales GI: soft, nontender, nondistended, + BS MS: no deformity or atrophy Skin: warm and dry, no rash Neuro:  Bilateral strength equal, no focal deficits  Psych: Alert and oriented x 3, normal affect  Wt Readings from Last 3 Encounters:  07/18/15 156 lb 11.2 oz (71.079 kg)      Studies/Labs Reviewed:     EKG:  EKG is  ordered today.  The ekg ordered today demonstrates   Recent Labs: 07/12/2015: ALT <5*; Magnesium 1.8 07/17/2015: BUN 8; Creatinine, Ser 0.83; Potassium 4.5; Sodium 137 07/18/2015: Hemoglobin 12.8*; Platelets 221   Recent Lipid Panel No results found for: CHOL, TRIG, HDL, CHOLHDL, VLDL, LDLCALC, LDLDIRECT  Additional studies/ records that were reviewed today  include:   Echo 07/12/15  EF 40-45%, diffusely hypokinetic - worse at the apex, mild MR, mildly reduced RVSF, mild RAE, PASP 40 mmHg  Carotid US [id number] LICA [id number]  Nm Pulmonary Perf And Vent   07/12/2015  IMPRESSION: Absent perfusion RIGHT upper lobe with additional subsegmental perfusion defect RIGHT lower lobe with normal ventilation in these areas. Findings represent a high probability for pulmonary embolism. Findings called to Janett Billow RN on [place] on 07/12/2015 at 1431 hours. Electronically Signed   By: Lavonia Dana M.D.   On: 07/12/2015 14:31   Dg Chest Portable 1 View  07/11/2015  IMPRESSION: No active disease. Electronically Signed   By: Misty Stanley M.D.   On: 07/11/2015  16:12     ASSESSMENT:     1. Atrial flutter, unspecified type ([place])   2. Dilated cardiomyopathy ([place])   3. Syncope and collapse   4. History of pulmonary embolism   5. CLL (chronic lymphocytic leukemia) ([place])   6. Essential hypertension      PLAN:     In order of problems listed above:  1. Chronic Atrial Fibrillation/Flutter -   2. Dilated CM -  Reportedly normal cardiac cath at hospital in [facility].  EF 40-45% in 1/17 similar to prior Echo in 10/16 in [place].    3. Syncope - Review of records from hospital indicates syncope felt to be 2/2 acute PE.  PPM interrogation did not demonstrate VT.  EF 40-45% as noted above by Echo.    4. Pulmonary Embolism - As noted, it was felt that he had recurrent PE on Eliquis.  Heme/onc changed his Eliquis to SQ Lovenox.  He has FU with Dr. Irene Limbo 2/20.    5. CLL- FU with Heme/Onc.    6. HTN -     Medication Adjustments/Labs and Tests Ordered: Current medicines are reviewed at length with the patient today.  Concerns regarding medicines are outlined above.  Medication changes, Labs and Tests ordered today are outlined in the Patient Instructions noted below. There are no Patient Instructions on file for this visit.  Signed, Richardson Dopp, PA-C  07/21/2015 8:42 AM    [facility], [place], [place]  [address] Phone: [phone]; Fax: [phone]     This encounter was created in error - please disregard.

## 2015-07-21 ENCOUNTER — Encounter: Payer: Medicare Other | Admitting: Physician Assistant

## 2015-07-21 DIAGNOSIS — I420 Dilated cardiomyopathy: Secondary | ICD-10-CM | POA: Insufficient documentation

## 2015-07-22 LAB — CULTURE, BODY FLUID W GRAM STAIN -BOTTLE: Culture: NO GROWTH

## 2015-07-22 LAB — CULTURE, BODY FLUID-BOTTLE

## 2015-08-02 ENCOUNTER — Ambulatory Visit: Payer: Medicare Other | Admitting: Hematology

## 2015-08-04 ENCOUNTER — Encounter: Payer: Self-pay | Admitting: Physician Assistant

## 2016-06-20 DIAGNOSIS — I4892 Unspecified atrial flutter: Secondary | ICD-10-CM | POA: Diagnosis not present

## 2016-06-20 DIAGNOSIS — Z7901 Long term (current) use of anticoagulants: Secondary | ICD-10-CM | POA: Diagnosis not present

## 2016-06-26 DIAGNOSIS — I4891 Unspecified atrial fibrillation: Secondary | ICD-10-CM | POA: Diagnosis not present

## 2016-07-04 DIAGNOSIS — I4891 Unspecified atrial fibrillation: Secondary | ICD-10-CM | POA: Diagnosis not present

## 2016-07-04 DIAGNOSIS — Z7901 Long term (current) use of anticoagulants: Secondary | ICD-10-CM | POA: Diagnosis not present

## 2016-07-25 DIAGNOSIS — I4892 Unspecified atrial flutter: Secondary | ICD-10-CM | POA: Diagnosis not present

## 2016-07-25 DIAGNOSIS — R609 Edema, unspecified: Secondary | ICD-10-CM | POA: Diagnosis not present

## 2016-07-25 DIAGNOSIS — Z7901 Long term (current) use of anticoagulants: Secondary | ICD-10-CM | POA: Diagnosis not present

## 2016-07-25 DIAGNOSIS — I4891 Unspecified atrial fibrillation: Secondary | ICD-10-CM | POA: Diagnosis not present

## 2016-08-08 DIAGNOSIS — Z7901 Long term (current) use of anticoagulants: Secondary | ICD-10-CM | POA: Diagnosis not present

## 2016-08-08 DIAGNOSIS — I4891 Unspecified atrial fibrillation: Secondary | ICD-10-CM | POA: Diagnosis not present

## 2016-08-16 DIAGNOSIS — I471 Supraventricular tachycardia: Secondary | ICD-10-CM | POA: Diagnosis not present

## 2016-08-16 DIAGNOSIS — Z7901 Long term (current) use of anticoagulants: Secondary | ICD-10-CM | POA: Diagnosis not present

## 2016-08-16 DIAGNOSIS — Z950 Presence of cardiac pacemaker: Secondary | ICD-10-CM | POA: Diagnosis not present

## 2016-08-16 DIAGNOSIS — I4892 Unspecified atrial flutter: Secondary | ICD-10-CM | POA: Diagnosis not present

## 2016-08-16 DIAGNOSIS — I4891 Unspecified atrial fibrillation: Secondary | ICD-10-CM | POA: Diagnosis not present

## 2016-08-31 DIAGNOSIS — I471 Supraventricular tachycardia: Secondary | ICD-10-CM | POA: Diagnosis not present

## 2016-08-31 DIAGNOSIS — Z7901 Long term (current) use of anticoagulants: Secondary | ICD-10-CM | POA: Diagnosis not present

## 2016-09-28 DIAGNOSIS — Z7901 Long term (current) use of anticoagulants: Secondary | ICD-10-CM | POA: Diagnosis not present

## 2016-09-28 DIAGNOSIS — I4892 Unspecified atrial flutter: Secondary | ICD-10-CM | POA: Diagnosis not present

## 2016-10-26 DIAGNOSIS — I872 Venous insufficiency (chronic) (peripheral): Secondary | ICD-10-CM | POA: Diagnosis not present

## 2016-10-26 DIAGNOSIS — I361 Nonrheumatic tricuspid (valve) insufficiency: Secondary | ICD-10-CM | POA: Diagnosis not present

## 2016-10-26 DIAGNOSIS — Z7901 Long term (current) use of anticoagulants: Secondary | ICD-10-CM | POA: Diagnosis not present

## 2016-10-26 DIAGNOSIS — I340 Nonrheumatic mitral (valve) insufficiency: Secondary | ICD-10-CM | POA: Diagnosis not present

## 2016-10-26 DIAGNOSIS — I484 Atypical atrial flutter: Secondary | ICD-10-CM | POA: Diagnosis not present

## 2016-10-26 DIAGNOSIS — I82403 Acute embolism and thrombosis of unspecified deep veins of lower extremity, bilateral: Secondary | ICD-10-CM | POA: Diagnosis not present

## 2016-10-26 DIAGNOSIS — I2782 Chronic pulmonary embolism: Secondary | ICD-10-CM | POA: Diagnosis not present

## 2016-10-26 DIAGNOSIS — I2699 Other pulmonary embolism without acute cor pulmonale: Secondary | ICD-10-CM | POA: Diagnosis not present

## 2016-10-26 DIAGNOSIS — I471 Supraventricular tachycardia: Secondary | ICD-10-CM | POA: Diagnosis not present

## 2016-10-26 DIAGNOSIS — Z950 Presence of cardiac pacemaker: Secondary | ICD-10-CM | POA: Diagnosis not present

## 2016-10-26 DIAGNOSIS — R42 Dizziness and giddiness: Secondary | ICD-10-CM | POA: Diagnosis not present

## 2016-10-26 DIAGNOSIS — I4892 Unspecified atrial flutter: Secondary | ICD-10-CM | POA: Diagnosis not present

## 2016-12-12 DIAGNOSIS — Z7901 Long term (current) use of anticoagulants: Secondary | ICD-10-CM | POA: Diagnosis not present

## 2016-12-12 DIAGNOSIS — I4892 Unspecified atrial flutter: Secondary | ICD-10-CM | POA: Diagnosis not present

## 2016-12-25 DIAGNOSIS — I4891 Unspecified atrial fibrillation: Secondary | ICD-10-CM | POA: Diagnosis not present

## 2017-01-11 DIAGNOSIS — I4891 Unspecified atrial fibrillation: Secondary | ICD-10-CM | POA: Diagnosis not present

## 2017-01-23 DIAGNOSIS — I4891 Unspecified atrial fibrillation: Secondary | ICD-10-CM | POA: Diagnosis not present

## 2017-04-06 DIAGNOSIS — I4892 Unspecified atrial flutter: Secondary | ICD-10-CM | POA: Diagnosis not present

## 2017-04-06 DIAGNOSIS — I471 Supraventricular tachycardia: Secondary | ICD-10-CM | POA: Diagnosis not present

## 2017-04-06 DIAGNOSIS — Z950 Presence of cardiac pacemaker: Secondary | ICD-10-CM | POA: Diagnosis not present

## 2017-04-06 DIAGNOSIS — Z7901 Long term (current) use of anticoagulants: Secondary | ICD-10-CM | POA: Diagnosis not present

## 2017-04-12 DIAGNOSIS — I4891 Unspecified atrial fibrillation: Secondary | ICD-10-CM | POA: Diagnosis not present

## 2017-04-18 DIAGNOSIS — Z7901 Long term (current) use of anticoagulants: Secondary | ICD-10-CM | POA: Diagnosis not present

## 2017-04-18 DIAGNOSIS — Z86711 Personal history of pulmonary embolism: Secondary | ICD-10-CM | POA: Diagnosis not present

## 2017-04-18 DIAGNOSIS — S52612A Displaced fracture of left ulna styloid process, initial encounter for closed fracture: Secondary | ICD-10-CM | POA: Diagnosis not present

## 2017-04-18 DIAGNOSIS — Z950 Presence of cardiac pacemaker: Secondary | ICD-10-CM | POA: Diagnosis not present

## 2017-04-18 DIAGNOSIS — S52611A Displaced fracture of right ulna styloid process, initial encounter for closed fracture: Secondary | ICD-10-CM | POA: Diagnosis not present

## 2017-04-18 DIAGNOSIS — M25521 Pain in right elbow: Secondary | ICD-10-CM | POA: Diagnosis not present

## 2017-04-18 DIAGNOSIS — Z79899 Other long term (current) drug therapy: Secondary | ICD-10-CM | POA: Diagnosis not present

## 2017-04-18 DIAGNOSIS — M19041 Primary osteoarthritis, right hand: Secondary | ICD-10-CM | POA: Diagnosis not present

## 2017-04-18 DIAGNOSIS — M79606 Pain in leg, unspecified: Secondary | ICD-10-CM | POA: Diagnosis not present

## 2017-04-18 DIAGNOSIS — I83893 Varicose veins of bilateral lower extremities with other complications: Secondary | ICD-10-CM | POA: Diagnosis not present

## 2017-04-18 DIAGNOSIS — M109 Gout, unspecified: Secondary | ICD-10-CM | POA: Diagnosis not present

## 2017-04-18 DIAGNOSIS — I872 Venous insufficiency (chronic) (peripheral): Secondary | ICD-10-CM | POA: Diagnosis not present

## 2017-04-18 IMAGING — CR DG CHEST 1V PORT
1 series · 1 of 1 positions shown · non-contrast
Comparison: None.

CLINICAL DATA: Dizziness with headaches and severely shortness of
breath off and on for several weeks.

EXAM:
PORTABLE CHEST 1 VIEW

[AP]
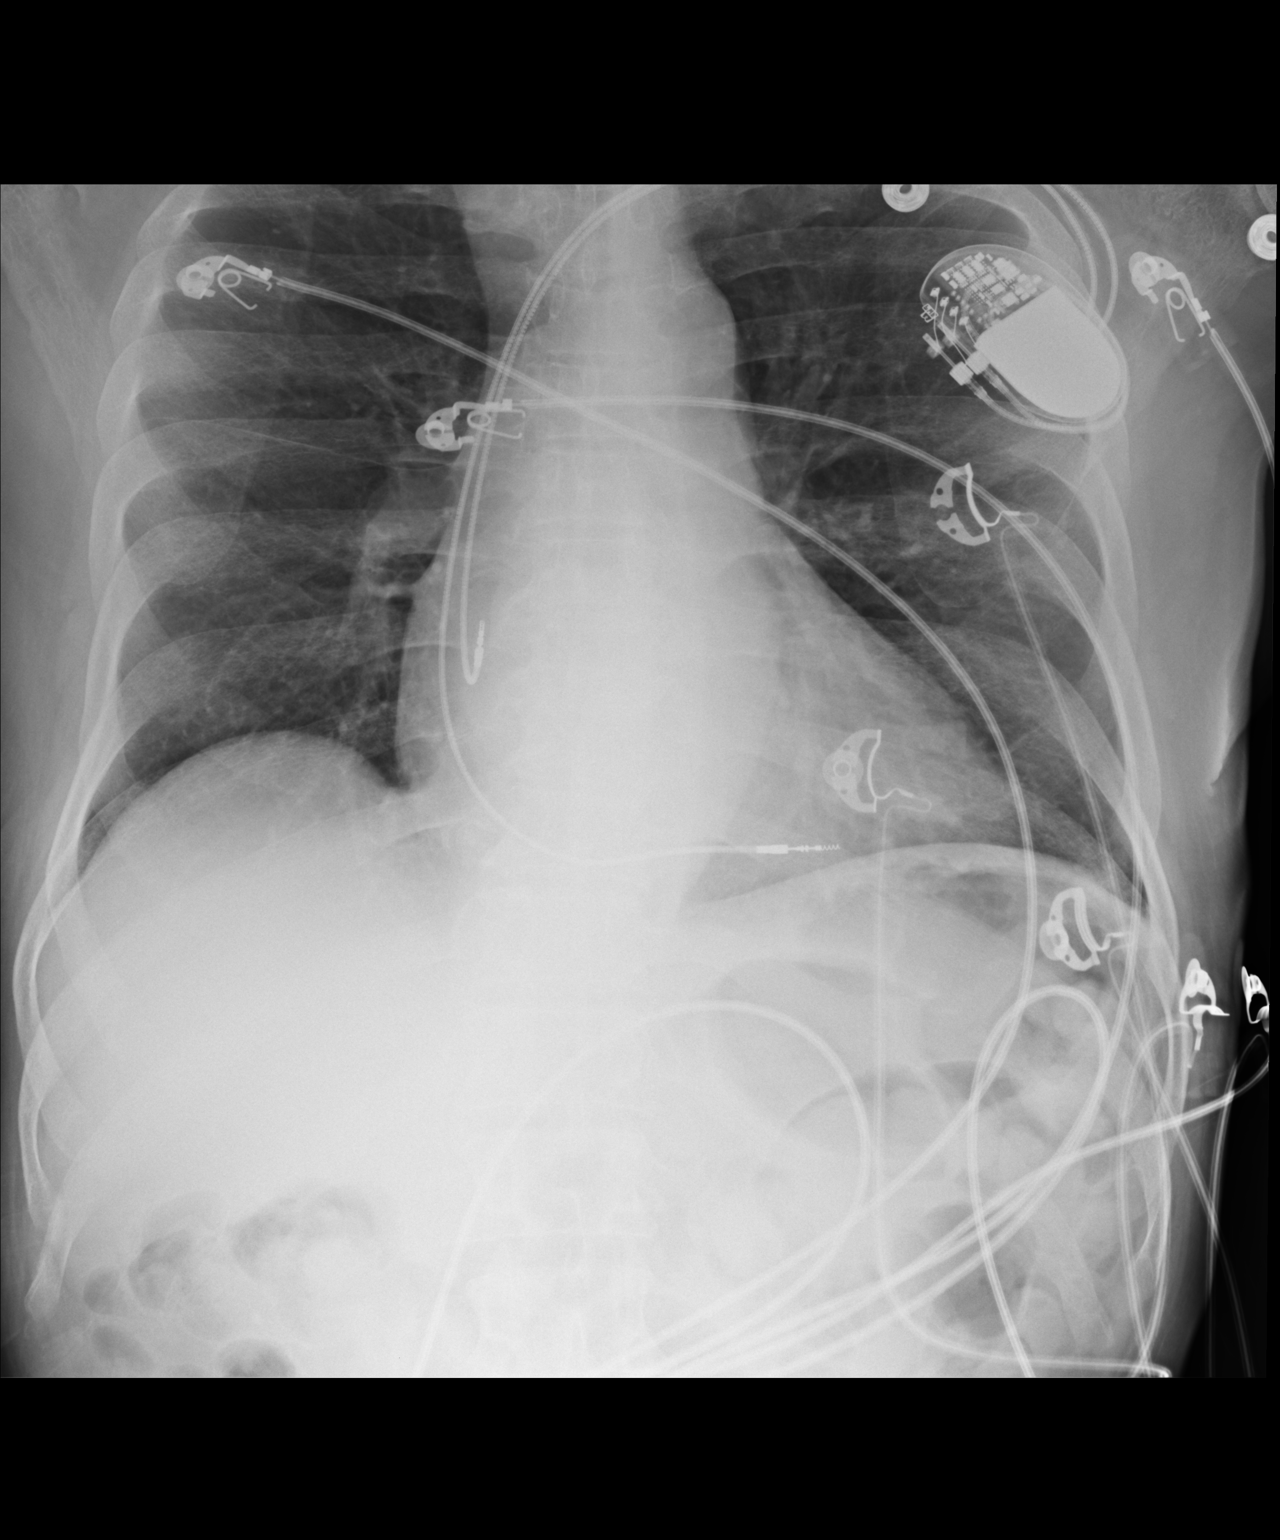

[1 of 1 positions shown; findings below may reference images not displayed]

FINDINGS: 0003 hours. Lordotic patient positioning. The lungs are clear
wiithout focal pneumonia, edema, pneumothorax or pleural effusion.
Cardiopericardial silhouette is at upper limits of normal for size.
The visualized bony structures of the thorax are intact. Left delete
permanent pacemaker noted. Telemetry leads overlie the chest.
IMPRESSION: No active disease.

## 2017-04-18 IMAGING — CT CT HEAD W/O CM
2 series · 15 of 40 positions shown, 18 images · non-contrast
Comparison: None.

CLINICAL DATA: Shortness breath and headache. Dizziness and syncope
today. No trauma.

EXAM:
CT HEAD WITHOUT CONTRAST
TECHNIQUE: Contiguous axial images were obtained from the base of the skull
through the vertex without intravenous contrast.

[Series 2: head 5.0 h30s · axial · 0.41mm/px · z∈[-107,+18]mm · 12 of 30 slices shown, 15 images]
[im 3/30  brain]
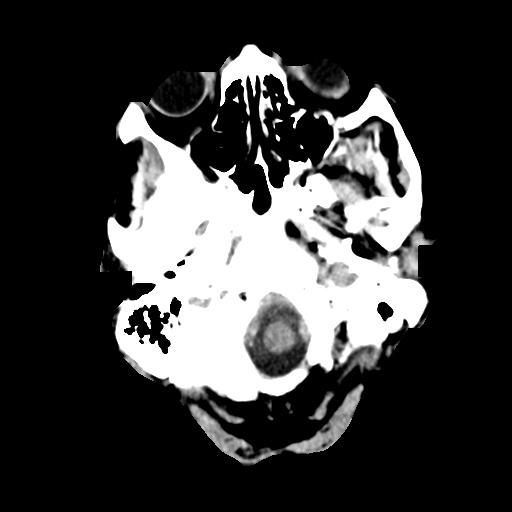
[im 3/30  bone]
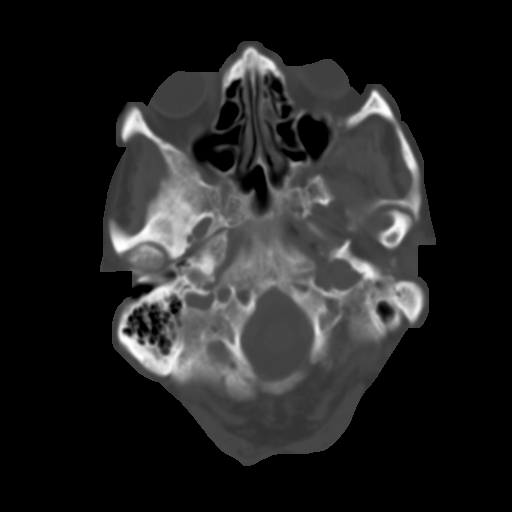
[im 5/30  brain]
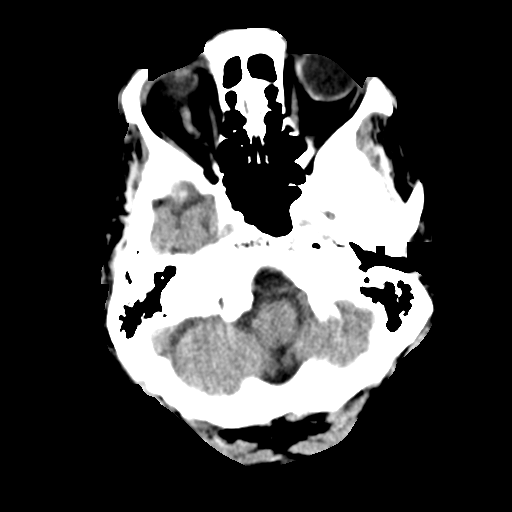
[im 7/30  brain]
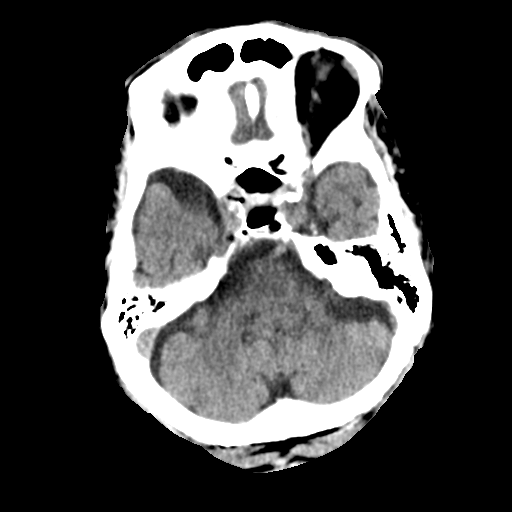
[im 10/30  brain]
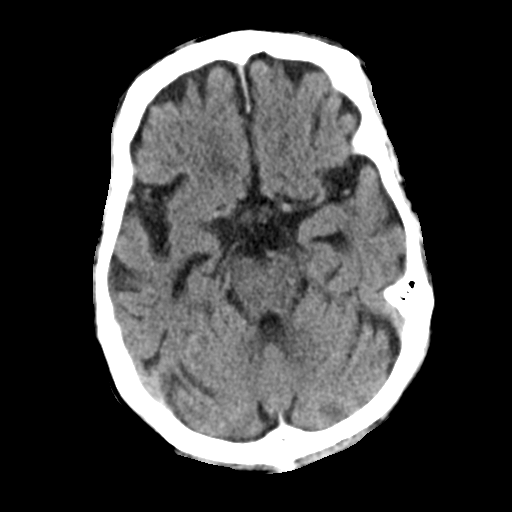
[im 12/30  brain]
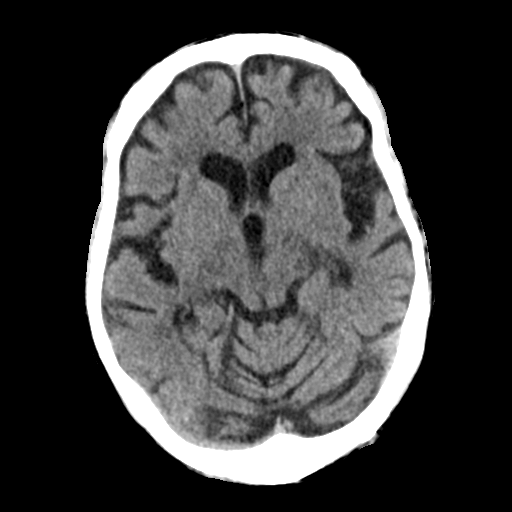
[im 12/30  bone]
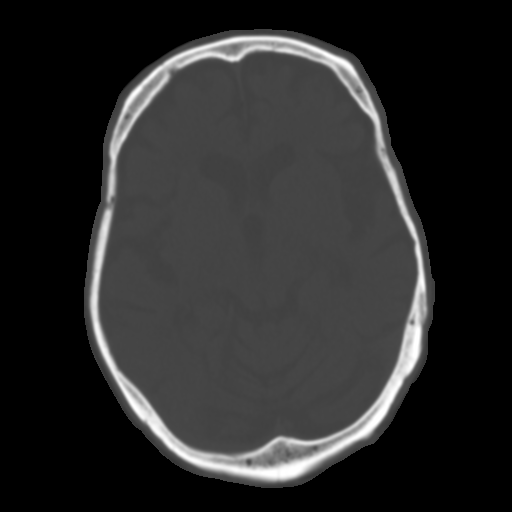
[im 14/30  brain]
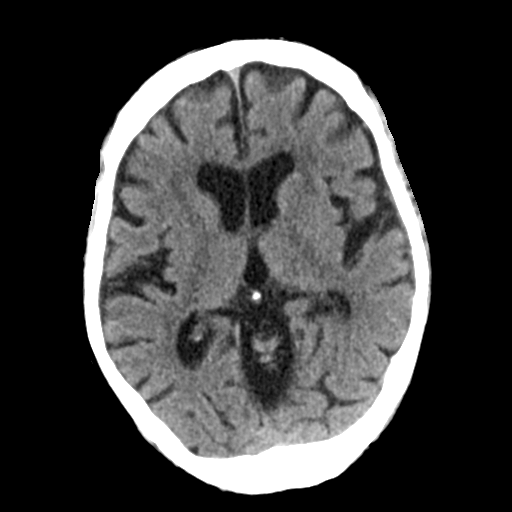
[im 17/30  brain]
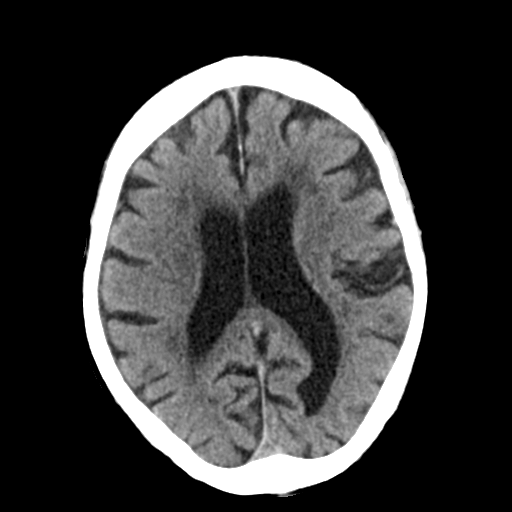
[im 19/30  brain]
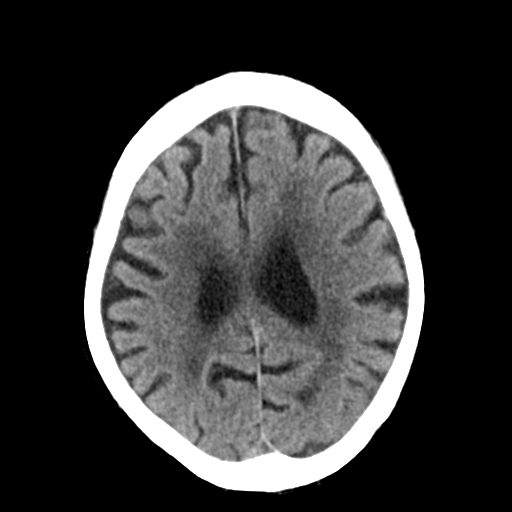
[im 21/30  brain]
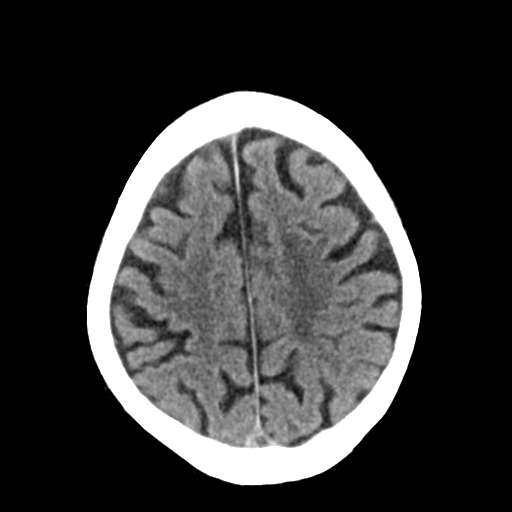
[im 21/30  bone]
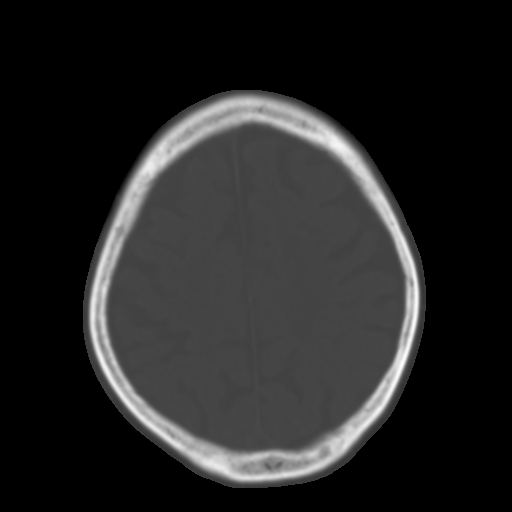
[im 24/30  brain]
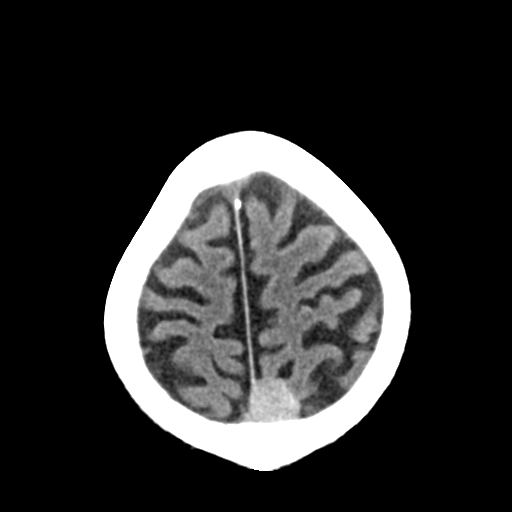
[im 26/30  brain]
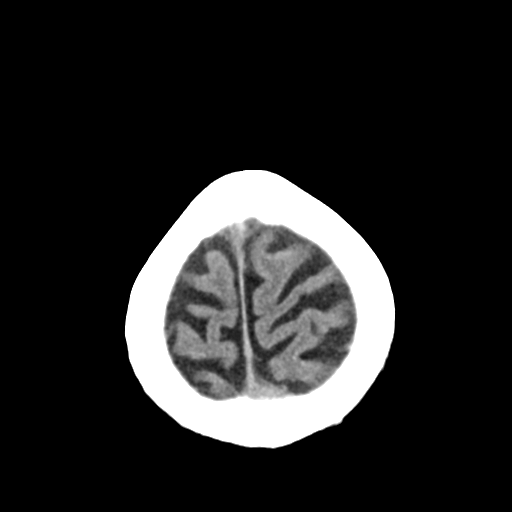
[im 28/30  brain]
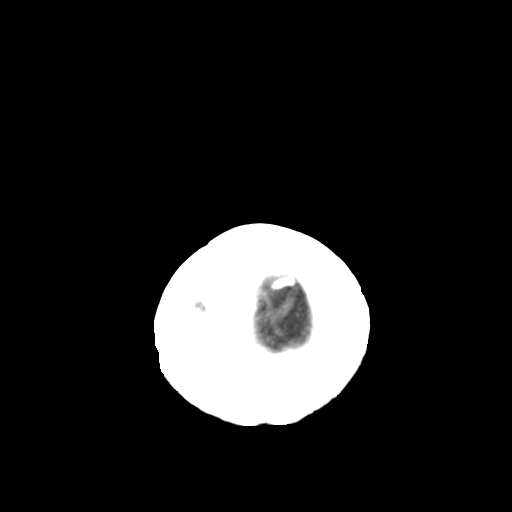

[Series 4: head 3.0 mpr · coronal · 0.29mm/px · 3 of 66 slices shown]
[im 22/66  brain]
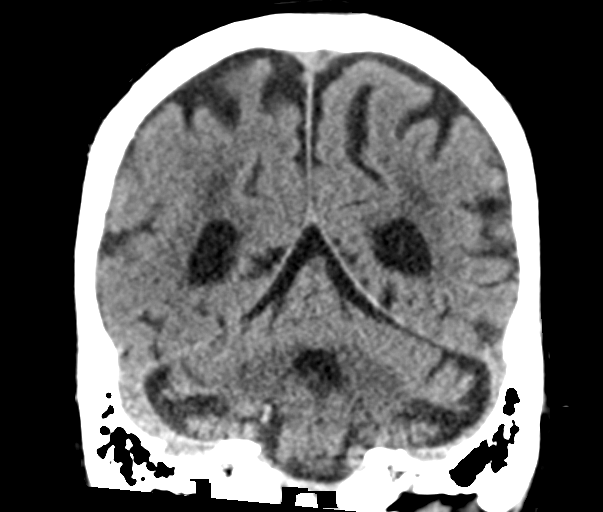
[im 29/66  brain]
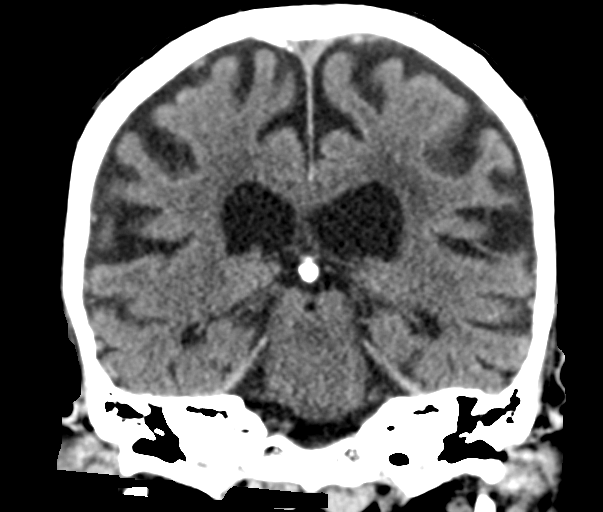
[im 37/66  brain]
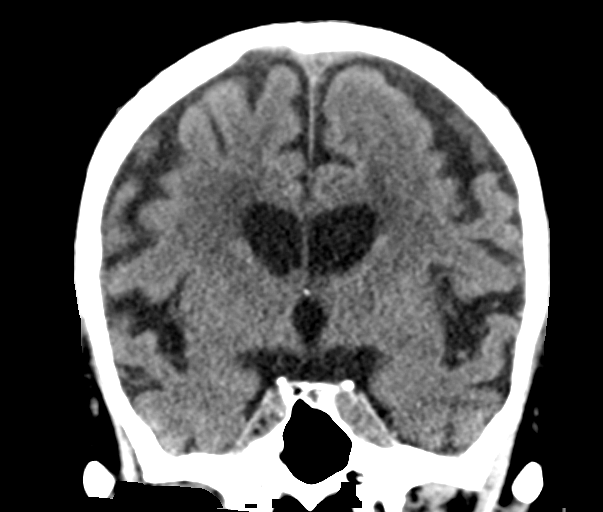

[15 of 40 positions shown; findings below may reference images not displayed]

FINDINGS: Moderate periventricular and diffuse subcortical white matter
hypoattenuation is present bilaterally. The basal ganglia are
intact. The insular ribbon is normal bilaterally. No focal cortical
defects are present.

Atherosclerotic calcifications are present in the cavernous internal
carotid arteries bilaterally is well is the dural margin of the
vertebral arteries. 82 cm hyperdense dural-based mass is associated
with the superior sagittal sinus on the left. This is most
compatible with a meningioma. No other focal mass lesions are
present. Ventricles are proportionate to the degree of atrophy. No
significant extra-axial fluid collection is present.
IMPRESSION: 1. No acute intracranial abnormality.
2. Moderate periventricular and subcortical white matter disease
bilaterally. This likely reflects the sequela of chronic
microvascular ischemia.
3. 2 cm hyperdense extra-axial dural-based mass lesion near the
vertex compatible with a meningioma.

## 2017-04-24 DIAGNOSIS — I471 Supraventricular tachycardia: Secondary | ICD-10-CM | POA: Diagnosis not present

## 2017-04-24 DIAGNOSIS — Z950 Presence of cardiac pacemaker: Secondary | ICD-10-CM | POA: Diagnosis not present

## 2017-04-24 DIAGNOSIS — I4892 Unspecified atrial flutter: Secondary | ICD-10-CM | POA: Diagnosis not present

## 2017-04-24 DIAGNOSIS — Z7901 Long term (current) use of anticoagulants: Secondary | ICD-10-CM | POA: Diagnosis not present

## 2017-04-25 DIAGNOSIS — S52614S Nondisplaced fracture of right ulna styloid process, sequela: Secondary | ICD-10-CM | POA: Diagnosis not present

## 2017-04-25 DIAGNOSIS — S63391S Traumatic rupture of other ligament of right wrist, sequela: Secondary | ICD-10-CM | POA: Diagnosis not present

## 2017-04-25 DIAGNOSIS — M7021 Olecranon bursitis, right elbow: Secondary | ICD-10-CM | POA: Diagnosis not present

## 2017-04-25 DIAGNOSIS — S63511A Sprain of carpal joint of right wrist, initial encounter: Secondary | ICD-10-CM | POA: Diagnosis not present

## 2017-05-09 DIAGNOSIS — Z7901 Long term (current) use of anticoagulants: Secondary | ICD-10-CM | POA: Diagnosis not present

## 2017-06-25 DIAGNOSIS — I4891 Unspecified atrial fibrillation: Secondary | ICD-10-CM | POA: Diagnosis not present

## 2017-06-25 DIAGNOSIS — Z7901 Long term (current) use of anticoagulants: Secondary | ICD-10-CM | POA: Diagnosis not present

## 2017-07-16 DIAGNOSIS — Z7901 Long term (current) use of anticoagulants: Secondary | ICD-10-CM | POA: Diagnosis not present

## 2017-09-11 DIAGNOSIS — Z7901 Long term (current) use of anticoagulants: Secondary | ICD-10-CM | POA: Diagnosis not present

## 2017-10-02 DIAGNOSIS — Z7901 Long term (current) use of anticoagulants: Secondary | ICD-10-CM | POA: Diagnosis not present

## 2017-10-24 DIAGNOSIS — I471 Supraventricular tachycardia: Secondary | ICD-10-CM | POA: Diagnosis not present

## 2017-10-24 DIAGNOSIS — I4892 Unspecified atrial flutter: Secondary | ICD-10-CM | POA: Diagnosis not present

## 2017-10-24 DIAGNOSIS — Z7901 Long term (current) use of anticoagulants: Secondary | ICD-10-CM | POA: Diagnosis not present

## 2017-10-24 DIAGNOSIS — Z950 Presence of cardiac pacemaker: Secondary | ICD-10-CM | POA: Diagnosis not present

## 2017-11-22 DIAGNOSIS — Z7901 Long term (current) use of anticoagulants: Secondary | ICD-10-CM | POA: Diagnosis not present

## 2018-01-22 ENCOUNTER — Ambulatory Visit: Payer: Medicare Other | Admitting: Physician Assistant

## 2018-02-27 ENCOUNTER — Ambulatory Visit (INDEPENDENT_AMBULATORY_CARE_PROVIDER_SITE_OTHER): Payer: Medicare Other | Admitting: Physician Assistant

## 2018-02-27 ENCOUNTER — Ambulatory Visit (INDEPENDENT_AMBULATORY_CARE_PROVIDER_SITE_OTHER): Payer: Medicare Other

## 2018-02-27 ENCOUNTER — Encounter: Payer: Self-pay | Admitting: Physician Assistant

## 2018-02-27 VITALS — BP 110/60 | HR 88 | Ht 69.0 in | Wt 141.1 lb

## 2018-02-27 DIAGNOSIS — I482 Chronic atrial fibrillation, unspecified: Secondary | ICD-10-CM

## 2018-02-27 DIAGNOSIS — Z86718 Personal history of other venous thrombosis and embolism: Secondary | ICD-10-CM | POA: Diagnosis not present

## 2018-02-27 DIAGNOSIS — I4892 Unspecified atrial flutter: Secondary | ICD-10-CM | POA: Diagnosis not present

## 2018-02-27 DIAGNOSIS — Z7901 Long term (current) use of anticoagulants: Secondary | ICD-10-CM | POA: Diagnosis not present

## 2018-02-27 DIAGNOSIS — Z950 Presence of cardiac pacemaker: Secondary | ICD-10-CM

## 2018-02-27 DIAGNOSIS — I2699 Other pulmonary embolism without acute cor pulmonale: Secondary | ICD-10-CM

## 2018-02-27 DIAGNOSIS — I5022 Chronic systolic (congestive) heart failure: Secondary | ICD-10-CM | POA: Diagnosis not present

## 2018-02-27 LAB — POCT INR: INR: 1.5 — AB (ref 2.0–3.0)

## 2018-02-27 MED ORDER — FUROSEMIDE 40 MG PO TABS: 40.0000 mg | ORAL_TABLET | Freq: Every day | ORAL | 3 refills | Status: DC

## 2018-02-27 NOTE — Patient Instructions (Signed)
Medication Instructions:  1. START LASIX 40 MG TAKE 1 TABLET EVERY DAY; NEW RX HAS BEEN SENT IN   Labwork: 1. TODAY BMET, CBC, TSH, LFT, PRO BNP  2. BMET TO BE DONE IN 1 WEEK  Testing/Procedures: Your physician has requested that you have a lower extremity venous duplex. This test is an ultrasound of the veins in the legs or arms. It looks at venous blood flow that carries blood from the heart to the legs or arms. Allow one hour for a Lower Venous exam. Allow thirty minutes for an Upper Venous exam. There are no restrictions or special instructions. PT HAS H/O OF DVT ; PER SCOTT WEAVER, PAC TO BE DONE TODAY OR TOMORROW   Follow-Up: 1. YOU HAVE A NEW PATIENT APPT TODAY WITH THE COUMADIN CLINIC    2. YOU ARE BEING REFERRED TO ELECTROPHYSIOLOGY  3. SCOTT WEAVER, PAC IN 2-3 WEEKS SAME DAY DR. Angelena Form IS IN THE OFFICE IF POSSIBLE  Any Other Special Instructions Will Be Listed Below (If Applicable).  YOU HAVE SIGNED A RELEASE OF INFORMATION IN ORDER FOR OUR OFFICE TO OBTAIN RECORDS FROM DR. MORI'S OFFICE IN [place]    If you need a refill on your cardiac medications before your next appointment, please call your pharmacy.

## 2018-02-27 NOTE — Patient Instructions (Signed)
Description   Take 1.5 tablets today, then take 1 tablet tomorrow, then resume same dosage 1/2 tablet daily except 1 tablet on Sundays and Wednesdays.  Recheck in 1 week.

## 2018-02-27 NOTE — Progress Notes (Signed)
Cardiology Office Note:    Date:  02/27/2018   ID:  [place], DOB 1925/03/25, MRN [id number]  PCP:  System, Pcp Not In  Cardiologist:  Lauree Chandler, MD   Electrophysiologist:  None   Referring MD: No ref. provider found   Chief Complaint  Patient presents with  . Atrial Fibrillation  . Congestive Heart Failure    History of Present Illness:    [place] is a 84 y.o. male with chronic lymphocytic leukemia, atrial fibrillation/flutter, recurrent DVT and pulmonary embolism, status post pacemaker.  Pacemaker was implanted in [place] in 2010.  He was previously followed by Dr. Alice Reichert [place], [place].  He was admitted in 2016 in [place] with a DVT and pulmonary embolism and was found to be in atrial flutter.  He was discharged on Apixaban.  Notes indicate he had a cardiac catheterization sometime in the past that demonstrated no CAD.  No records are available.  He was admitted to [facility] in January 2017 while visiting family.  He was admitted with syncope.  He was found to have an acute pulmonary embolism and DVT.  This occurred while taking Apixaban.  He was seen by hematology.  He was taken off of Apixaban and discharged at that time on Lovenox with plans to follow-up in 1 month.  Cardiology saw the patient.  Interrogation of his device demonstrated no ventricular tachycardia.  His EF was 40-45% by echocardiogram.  He was not placed on ACE inhibitor secondary to allergy.     [name] returns to reestablish with our practice for congestive heart failure and atrial fibrillation.  He is here today with his wife.  After returning to [place] in January 2017, his cardiologist transitioned him from Lovenox to another medication.  He cannot recall the name.  He tells me he had another DVT while on this drug.  He is now on warfarin.  He moved here in May 2019.  He has not seen primary care or had follow-up on his INR.  He sometimes misses doses of warfarin.   He is on Lasix but does not take this on a daily basis.  Over the last several weeks, he has developed significant lower extremity edema from the knees down.  His calves are somewhat tender.  He has also noted increased shortness of breath over the last few weeks.  He sleeps on 3 pillows and has noted paroxysmal nocturnal dyspnea.  He denies chest discomfort or syncope.  Prior CV studies:   The following studies were reviewed today:  Echo 07/12/15 EF 40-45, diff HK (worse at apex), mild MR, mild reduced RVSF, mild RAE, PASP 40  Carotid US 3/78/58 LICA 8-50; no disease in RICA  Past Medical History:  Diagnosis Date  . Brain tumor (benign) ([place])   . History of blood clots   . Hypertension   . Leukemia, chronic lymphocytic ([place])   . Pacemaker   . Pulmonary embolism ([place])    Surgical Hx: The patient  has a past surgical history that includes cataracts; Appendectomy; and Hernia repair.   Current Medications: Current Meds  Medication Sig  . acetaminophen (TYLENOL) 500 MG tablet Take 1,000 mg by mouth every 6 (six) hours as needed (pain).  . ALLOPURINOL PO Take by mouth as directed.  Marland Kitchen HYDROcodone-Acetaminophen (NORCO PO) Take by mouth as directed.  . metoprolol tartrate (LOPRESSOR) 25 MG tablet Take 12.5 mg by mouth 2 (two) times daily.  . Multiple Vitamin (  MULTIVITAMIN) tablet Take 1 tablet by mouth daily.  Marland Kitchen OVER THE COUNTER MEDICATION Sodium Chloride 2 sprays each nostrle BID  . senna (SENOKOT) 8.6 MG tablet Take 1 tablet by mouth daily.  Marland Kitchen warfarin (COUMADIN) 5 MG tablet TAKE ONE TABLET BY MOUTH ON SUNDAY AND WEDNESDAY AND TAKE ONE HALF TABLET BY MOUTH ALL OTHER DAY  . [DISCONTINUED] furosemide (LASIX) 40 MG tablet Take 40 mg by mouth daily as needed (ankle swelling).      Allergies:   Iodine; Ivp dye [iodinated diagnostic agents]; Lisinopril; Losartan; and Zocor [simvastatin]   Social History   Tobacco Use  . Smoking status: Never Smoker  . Smokeless tobacco: Never Used    Substance Use Topics  . Alcohol use: Yes    Comment: 1 pint bourbon daily (plus )   . Drug use: No     Family Hx: The patient's family history includes CAD in his mother; Peripheral vascular disease in his mother.  ROS:   Please see the history of present illness.    Review of Systems  Constitution: Positive for chills, decreased appetite, malaise/fatigue and weight loss.  HENT: Positive for hearing loss.   Eyes: Positive for visual disturbance.  Cardiovascular: Positive for dyspnea on exertion, irregular heartbeat and leg swelling.  Respiratory: Positive for shortness of breath.   Skin: Positive for rash.  Musculoskeletal: Positive for back pain, joint pain and joint swelling.  Gastrointestinal: Positive for constipation.  Neurological: Positive for dizziness and loss of balance.   All other systems reviewed and are negative.   EKGs/Labs/Other Test Reviewed:    EKG:  EKG is   ordered today.  The ekg ordered today demonstrates V paced, HR 88  Recent Labs: No results found for requested labs within last 8760 hours.   Recent Lipid Panel No results found for: CHOL, TRIG, HDL, CHOLHDL, LDLCALC, LDLDIRECT  Physical Exam:    VS:  BP 110/60   Pulse 88   Ht 5\' 9"  (1.753 m)   Wt 141 lb 1.9 oz (64 kg)   BMI 20.84 kg/m     Wt Readings from Last 3 Encounters:  02/27/18 141 lb 1.9 oz (64 kg)  07/18/15 156 lb 11.2 oz (71.1 kg)     Physical Exam  Constitutional: He is oriented to person, place, and time. He appears well-developed and well-nourished. No distress.  HENT:  Head: Normocephalic and atraumatic.  Eyes: No scleral icterus.  Neck: No JVD present. No thyromegaly present.  Cardiovascular: Normal rate. An irregularly irregular rhythm present.  No murmur heard. Pulmonary/Chest: Effort normal. He has no rales.  Abdominal: Soft. He exhibits no distension.  Musculoskeletal: He exhibits edema (3+ bilat LE edema; calves tender).  Lymphadenopathy:    He has no cervical  adenopathy.  Neurological: He is alert and oriented to person, place, and time.  Skin: Skin is warm and dry.  Psychiatric: He has a normal mood and affect.    ASSESSMENT & PLAN:    Chronic systolic CHF (congestive heart failure) ([place]) He has a history of LV dysfunction with an EF of 40-45% by echocardiogram in January 2017.  Etiology of his cardiomyopathy is not entirely clear.  [facility]; [phone], [place], [place] [address]; phone [phone]; fax [phone]).  He does not recall having any further tests on his heart since returning to [place].  As noted, he has been in [place] since May.  He does have an  [facility]ment [facility] weeks.  He prefers to have his cardiology care at our office.  He does note worsening shortness of breath.  His lungs are clear and his neck veins are flat.  He has not been taking his Lasix on a regular basis.  The cause of his lower extremity swelling may be from recurrent DVT, chronic DVT or decompensated heart failure.  Prior to obtaining a follow-up echocardiogram we need to obtain records from his cardiologist in [place].  Given his advanced age, I am not certain that he would be a candidate for invasive cardiac evaluation.  I would attempt medical therapy if at all possible.  -Resume Lasix 40 mg daily  -Labs today: BMET, CBC, TSH, LFTs, BNP  -Repeat BMET 1 week  -Obtain records from physician in [place]  -Consider repeat echo if no echo done since January 2017  Chronic atrial fibrillation [facility])  He is now on warfarin.  I will establish him with our Coumadin clinic.  Obtain CBC today.  History of DVT (deep vein thrombosis)  He has significant lower extremity swelling.  This is bilateral.  Does have a history of prior DVT as well as pulmonary embolism.  He has not been taking his warfarin on a regular basis.  His INR has not been checked in  several months.  As noted, his INR will be checked today with our Coumadin clinic.  Given his prior history, he needs lower extremity venous Dopplers to rule out acute DVT.  If his ultrasound is positive for acute DVT, he will need admission to the hospital.  -Arrange lower extremity venous Dopplers either today or tomorrow  -Continue warfarin  Cardiac pacemaker in situ Refer to EP for follow-up on his pacemaker.  Total time spent with patient today 45 minutes. This includes reviewing records, evaluating the patient and coordinating care. Face-to-face time >50%.   Dispo:  Return in about 2 weeks (around 03/13/2018) for Close Follow Up with Dr. Angelena Form, or Richardson Dopp, PA-C.   Medication Adjustments/Labs and Tests Ordered: Current medicines are reviewed at length with the patient today.  Concerns regarding medicines are outlined above.  Tests Ordered: Orders Placed This Encounter  Procedures  . Basic Metabolic Panel (BMET)  . CBC  . TSH  . Hepatic function panel  . Pro b natriuretic peptide  . Basic Metabolic Panel (BMET)  . Ambulatory referral to Cardiac Electrophysiology  . EKG 12-Lead   Medication Changes: Meds ordered this encounter  Medications  . furosemide (LASIX) 40 MG tablet    Sig: Take 1 tablet (40 mg total) by mouth daily.    Dispense:  90 tablet    Refill:  3    Signed, Richardson Dopp, PA-C  02/27/2018 5:58 PM    [facility] [place], [place], [place]  [address] Phone: [phone]; Fax: [phone]

## 2018-02-28 ENCOUNTER — Encounter: Payer: Self-pay | Admitting: Physician Assistant

## 2018-02-28 ENCOUNTER — Other Ambulatory Visit: Payer: Self-pay | Admitting: Physician Assistant

## 2018-02-28 ENCOUNTER — Telehealth: Payer: Self-pay | Admitting: *Deleted

## 2018-02-28 DIAGNOSIS — Z86718 Personal history of other venous thrombosis and embolism: Secondary | ICD-10-CM

## 2018-02-28 DIAGNOSIS — R600 Localized edema: Secondary | ICD-10-CM

## 2018-02-28 LAB — BASIC METABOLIC PANEL
BUN/Creatinine Ratio: 10 (ref 10–24)
BUN: 9 mg/dL — ABNORMAL LOW (ref 10–36)
CO2: 25 mmol/L (ref 20–29)
CREATININE: 0.88 mg/dL (ref 0.76–1.27)
Calcium: 10.1 mg/dL (ref 8.6–10.2)
Chloride: 99 mmol/L (ref 96–106)
GFR calc Af Amer: 87 mL/min/{1.73_m2} (ref >59)
GFR calc non Af Amer: 75 mL/min/{1.73_m2} (ref >59)
GLUCOSE: 92 mg/dL (ref 65–99)
POTASSIUM: 4.3 mmol/L (ref 3.5–5.2)
SODIUM: 143 mmol/L (ref 134–144)

## 2018-02-28 LAB — CBC
Hematocrit: 36.7 % — ABNORMAL LOW (ref 37.5–51.0)
Hemoglobin: 12.3 g/dL — ABNORMAL LOW (ref 13.0–17.7)
MCH: 29.6 pg (ref 26.6–33.0)
MCHC: 33.5 g/dL (ref 31.5–35.7)
MCV: 88 fL (ref 79–97)
PLATELETS: 271 10*3/uL (ref 150–450)
RBC: 4.15 x10E6/uL (ref 4.14–5.80)
RDW: 16.3 % — ABNORMAL HIGH (ref 12.3–15.4)
WBC: 6.7 10*3/uL (ref 3.4–10.8)

## 2018-02-28 LAB — HEPATIC FUNCTION PANEL
ALBUMIN: 4.2 g/dL (ref 3.2–4.6)
ALT: <5 IU/L (ref 0–44)
AST: 19 IU/L (ref 0–40)
Alkaline Phosphatase: 140 IU/L — ABNORMAL HIGH (ref 39–117)
BILIRUBIN TOTAL: 1.5 mg/dL — AB (ref 0.0–1.2)
Bilirubin, Direct: 0.5 mg/dL — ABNORMAL HIGH (ref 0.00–0.40)
TOTAL PROTEIN: 6.5 g/dL (ref 6.0–8.5)

## 2018-02-28 LAB — TSH: TSH: 1.54 u[IU]/mL (ref 0.450–4.500)

## 2018-02-28 LAB — PRO B NATRIURETIC PEPTIDE: NT-Pro BNP: 1287 pg/mL — ABNORMAL HIGH (ref 0–486)

## 2018-02-28 NOTE — Telephone Encounter (Signed)
-----   Message from Liliane Shi, PA-C sent at 02/28/2018 10:20 AM EDT ----- Renal function, potassium, TSH are normal.  The hemoglobin is stable.  AST, ALT, protein and albumin are normal. Alkaline phosphatase and bilirubin are elevated - he needs this followed by primary care (once he is established).   BNP is elevated. Medication changes / Follow up labs / Other changes or recommendations:    - Continue taking Lasix 40 mg QD (every day) as recommended at office visit yesterday.  - He should already be scheduled for a BMET in 1 week.  Richardson Dopp, PA-C 02/28/2018 10:17 AM

## 2018-02-28 NOTE — Telephone Encounter (Signed)
Left message to go over lab results.  

## 2018-03-01 ENCOUNTER — Ambulatory Visit (HOSPITAL_COMMUNITY)
Admission: RE | Admit: 2018-03-01 | Discharge: 2018-03-01 | Disposition: A | Discharge status: Home / Self Care | Payer: Medicare Other | Source: Ambulatory Visit | Attending: Internal Medicine | Admitting: Internal Medicine

## 2018-03-01 DIAGNOSIS — Z86718 Personal history of other venous thrombosis and embolism: Secondary | ICD-10-CM | POA: Diagnosis not present

## 2018-03-01 DIAGNOSIS — R600 Localized edema: Secondary | ICD-10-CM | POA: Diagnosis not present

## 2018-03-01 NOTE — Telephone Encounter (Signed)
Pt here in the office today having his ultrasound. Pt have me permission ok to s/w his wife about lab results. I reviewed results with the pt's wife and went over recommendation to continue on the lasix 40 mg daily as the BNP (fluid marker) was elevated. BMET set for 9/25. Pt's wife thanked me for the call.

## 2018-03-01 NOTE — Telephone Encounter (Signed)
-----   Message from Liliane Shi, PA-C sent at 02/28/2018 10:20 AM EDT ----- Renal function, potassium, TSH are normal.  The hemoglobin is stable.  AST, ALT, protein and albumin are normal. Alkaline phosphatase and bilirubin are elevated - he needs this followed by primary care (once he is established).   BNP is elevated. Medication changes / Follow up labs / Other changes or recommendations:    - Continue taking Lasix 40 mg QD (every day) as recommended at office visit yesterday.  - He should already be scheduled for a BMET in 1 week.  Richardson Dopp, PA-C 02/28/2018 10:17 AM

## 2018-03-06 ENCOUNTER — Other Ambulatory Visit: Payer: Medicare Other | Admitting: *Deleted

## 2018-03-06 ENCOUNTER — Telehealth: Payer: Self-pay | Admitting: *Deleted

## 2018-03-06 ENCOUNTER — Ambulatory Visit (INDEPENDENT_AMBULATORY_CARE_PROVIDER_SITE_OTHER): Payer: Medicare Other | Admitting: *Deleted

## 2018-03-06 DIAGNOSIS — I5022 Chronic systolic (congestive) heart failure: Secondary | ICD-10-CM | POA: Diagnosis not present

## 2018-03-06 DIAGNOSIS — I4892 Unspecified atrial flutter: Secondary | ICD-10-CM

## 2018-03-06 DIAGNOSIS — I10 Essential (primary) hypertension: Secondary | ICD-10-CM

## 2018-03-06 DIAGNOSIS — Z7901 Long term (current) use of anticoagulants: Secondary | ICD-10-CM

## 2018-03-06 DIAGNOSIS — I2699 Other pulmonary embolism without acute cor pulmonale: Secondary | ICD-10-CM | POA: Diagnosis not present

## 2018-03-06 LAB — BASIC METABOLIC PANEL
BUN / CREAT RATIO: 13 (ref 10–24)
BUN: 14 mg/dL (ref 10–36)
CHLORIDE: 94 mmol/L — AB (ref 96–106)
CO2: 27 mmol/L (ref 20–29)
Calcium: 9.3 mg/dL (ref 8.6–10.2)
Creatinine, Ser: 1.09 mg/dL (ref 0.76–1.27)
GFR calc non Af Amer: 59 mL/min/{1.73_m2} — ABNORMAL LOW (ref >59)
GFR, EST AFRICAN AMERICAN: 68 mL/min/{1.73_m2} (ref >59)
Glucose: 83 mg/dL (ref 65–99)
POTASSIUM: 3.5 mmol/L (ref 3.5–5.2)
Sodium: 141 mmol/L (ref 134–144)

## 2018-03-06 LAB — POCT INR: INR: 2.7 (ref 2.0–3.0)

## 2018-03-06 MED ORDER — POTASSIUM CHLORIDE CRYS ER 20 MEQ PO TBCR: 20.0000 meq | EXTENDED_RELEASE_TABLET | Freq: Every day | ORAL | 3 refills | Status: DC

## 2018-03-06 NOTE — Telephone Encounter (Signed)
Pt has given verbal permission ok to s/w his wife. Pt's wife has been notified of lab results and recommendations to have pt start K+ 20 meq daily, Rx has been sent. Bmet to be 10/2 at f/u appt. Pt's wife thanked me for the call.

## 2018-03-06 NOTE — Patient Instructions (Signed)
Description   Continue taking 1/2 tablet daily except 1 tablet on Sundays and Wednesdays.  Recheck in 1 week.

## 2018-03-06 NOTE — Telephone Encounter (Signed)
-----   Message from Liliane Shi, [place] sent at 03/06/2018  4:19 PM EDT ----- All values are normal or within acceptable limits.   Potassium is low normal. Medication changes / Follow up labs / Other changes or recommendations:    - Start K+ 20 mEq QD  - BMET at scheduled follow up appt with me in 1 week Richardson Dopp, PA-C 03/06/2018 4:18 PM

## 2018-03-13 ENCOUNTER — Encounter: Payer: Self-pay | Admitting: Physician Assistant

## 2018-03-13 ENCOUNTER — Ambulatory Visit (INDEPENDENT_AMBULATORY_CARE_PROVIDER_SITE_OTHER): Payer: Medicare Other | Admitting: *Deleted

## 2018-03-13 ENCOUNTER — Ambulatory Visit (INDEPENDENT_AMBULATORY_CARE_PROVIDER_SITE_OTHER): Payer: Medicare Other | Admitting: Physician Assistant

## 2018-03-13 VITALS — BP 104/56 | HR 56 | Ht 69.0 in | Wt 139.8 lb

## 2018-03-13 DIAGNOSIS — R600 Localized edema: Secondary | ICD-10-CM | POA: Diagnosis not present

## 2018-03-13 DIAGNOSIS — I2699 Other pulmonary embolism without acute cor pulmonale: Secondary | ICD-10-CM

## 2018-03-13 DIAGNOSIS — I5022 Chronic systolic (congestive) heart failure: Secondary | ICD-10-CM | POA: Diagnosis not present

## 2018-03-13 DIAGNOSIS — Z950 Presence of cardiac pacemaker: Secondary | ICD-10-CM | POA: Diagnosis not present

## 2018-03-13 DIAGNOSIS — I4821 Permanent atrial fibrillation: Secondary | ICD-10-CM

## 2018-03-13 DIAGNOSIS — Z7901 Long term (current) use of anticoagulants: Secondary | ICD-10-CM | POA: Diagnosis not present

## 2018-03-13 DIAGNOSIS — Z86718 Personal history of other venous thrombosis and embolism: Secondary | ICD-10-CM

## 2018-03-13 DIAGNOSIS — I4892 Unspecified atrial flutter: Secondary | ICD-10-CM | POA: Diagnosis not present

## 2018-03-13 LAB — POCT INR: INR: 2.5 (ref 2.0–3.0)

## 2018-03-13 NOTE — Patient Instructions (Signed)
Medication Instructions:  Your physician recommends that you continue on your current medications as directed. Please refer to the Current Medication list given to you today.   Labwork: TODAY:  BMET  Testing/Procedures: None ordered  Follow-Up: Your physician wants you to follow-up in: [place] DR. Angelena Form   You will receive a reminder letter in the mail two months in advance. If you don't receive a letter, please call our office to schedule the follow-up appointment.   Any Other Special Instructions Will Be Listed Below (If Applicable).     If you need a refill on your cardiac medications before your next appointment, please call your pharmacy.

## 2018-03-13 NOTE — Progress Notes (Signed)
Cardiology Office Note:    Date:  03/13/2018   ID:  Randall Lawrence, DOB August 02, 1924, MRN [id number]  PCP:  System, Pcp Not In  Cardiologist:  Lauree Chandler, MD  Electrophysiologist:  None   Referring MD: No ref. provider found   Chief Complaint  Patient presents with  . Follow-up    Atrial fibrillation, congestive heart failure    History of Present Illness:    Randall Lawrence is a 84 y.o. male with hronic lymphocytic leukemia, atrial fibrillation/flutter, recurrent DVT and pulmonary embolism, status post pacemaker.  Pacemaker was implanted in [place] in 2010.  He was previously followed by Dr. Maximiano Coss in [place], [place].  Records indicate he had a normal heart cath in the past.  He was previously on Apixaban for Atrial Fibrillation and DVT/pulmonary embolism.  He was admitted to [facility] in 2017 while visiting family with syncope in the setting of an acute DVT and pulmonary embolism.  He was switched from Apixaban to Lovenox.  EF was 40-45 at that time.  He followed up with his cardiologist in [facility].  He then re-established with our office 02/27/18.  He moved to this area in May 2019.  When I saw him a few weeks ago, his legs were significantly swollen.  His BNP was also significantly elevated.  He had evidence of volume excess.  I adjusted his Lasix.  Venous duplex was consistent with chronic DVT but no acute DVT.  I also established him with our Coumadin clinic.  Records from his prior cardiologist were received.  An echocardiogram in May 2017 demonstrated an EF of 50%, mild biatrial enlargement, moderate mitral regurgitation, moderate to severe tricuspid regurgitation and RVSP of 40.6.  Records indicate he was on amiodarone when he was last seen in May 2019.  Randall Lawrence returns for follow-up.  He is here today with his daughter.  He denies chest discomfort.  He has chronic shortness of breath.  He has noted some dizziness recently that he describes as spinning.  His daughter notes that  he has had this from time to time.  He denies syncope, paroxysmal nocturnal dyspnea.  His legs are still swollen.  However, they are improved from last visit.  Prior CV studies:   The following studies were reviewed today:  Venous Duplex 9/201/9 Final Interpretation: Right: Findings consistent with chronic deep vein thrombosis involving the right femoral vein, and right popliteal vein. Findings appear essentially unchanged compared to previous examination. No cystic structure found in the popliteal fossa. Left: Findings consistent with chronic deep vein thrombosis involving the left femoral vein. Findings appear improved from previous examination. No cystic structure found in the popliteal fossa.  Echo 5/17 [facility] in [place], [place]) EF 50, mild BAE, moderate MR, moderate to severe TR, RVSP 40.6  Echo 07/12/15 EF 40-45, diff HK (worse at apex), mild MR, mild reduced RVSF, mild RAE, PASP 40  Carotid US 9/62/22 LICA 9-79; no disease in RICA  Echo 03/15/2015 [facility] in [place], [place]) EF 45-50, evidence of RV strain, mild MR, moderate TR, RVSP 54  Carotid US 10/16 [facility] in [place], [place]) Normal  V/Q 10/16 [facility] in [place], [place]) Bilateral upper lobe mismatch consistent with pulmonary embolism   Past Medical History:  Diagnosis Date  . Atrial fibrillation and flutter ([place])   . Brain tumor (benign) ([place])   . Chronic systolic CHF (congestive heart failure) ([place])    Echo 5/17 [facility] in  [place], [place]):  EF 50, mild BAE, moderate MR, moderate to severe TR, RVSP 40.6  . History of recurrent deep vein thrombosis (DVT)    hx of pulmonary embolism // recurrent DVT/PE on Apixaban in 2017 // now on Warfarin  . Hypertension   . Leukemia, chronic lymphocytic ([place])   . Pacemaker   . Pulmonary embolism ([place])    Surgical Hx: The patient  has a past surgical history that includes cataracts; Appendectomy; and Hernia repair.    Current Medications: Current Meds  Medication Sig  . acetaminophen (TYLENOL) 500 MG tablet Take 1,000 mg by mouth every 6 (six) hours as needed (pain).  . ALLOPURINOL PO Take by mouth as directed.  . furosemide (LASIX) 40 MG tablet Take 1 tablet (40 mg total) by mouth daily.  Marland Kitchen HYDROcodone-Acetaminophen (NORCO PO) Take by mouth as directed.  . metoprolol tartrate (LOPRESSOR) 25 MG tablet Take 12.5 mg by mouth 2 (two) times daily.  . Multiple Vitamin (MULTIVITAMIN) tablet Take 1 tablet by mouth daily.  Marland Kitchen OVER THE COUNTER MEDICATION Sodium Chloride 2 sprays each nostrle BID  . potassium chloride SA (KLOR-CON M20) 20 MEQ tablet Take 1 tablet (20 mEq total) by mouth daily.  Marland Kitchen senna (SENOKOT) 8.6 MG tablet Take 1 tablet by mouth daily.  Marland Kitchen warfarin (COUMADIN) 5 MG tablet TAKE ONE TABLET BY MOUTH ON SUNDAY AND WEDNESDAY AND TAKE ONE HALF TABLET BY MOUTH ALL OTHER DAY     Allergies:   Iodine; Ivp dye [iodinated diagnostic agents]; Lisinopril; Losartan; and Zocor [simvastatin]   Social History   Tobacco Use  . Smoking status: Never Smoker  . Smokeless tobacco: Never Used  Substance Use Topics  . Alcohol use: Yes    Comment: 1 pint bourbon daily (plus )   . Drug use: No     Family Hx: The patient's family history includes CAD in his mother; Peripheral vascular disease in his mother.  ROS:   Please see the history of present illness.    Review of Systems  Constitution: Positive for decreased appetite.  Cardiovascular: Positive for leg swelling.  Musculoskeletal: Positive for joint pain and joint swelling.  Neurological: Positive for dizziness.   All other systems reviewed and are negative.   EKGs/Labs/Other Test Reviewed:    EKG:  EKG is not ordered today.    Recent Labs: 02/27/2018: ALT <5; Hemoglobin 12.3; NT-Pro BNP 1,287; Platelets 271; TSH 1.540 03/06/2018: BUN 14; Creatinine, Ser 1.09; Potassium 3.5; Sodium 141   Recent Lipid Panel No results found for: CHOL, TRIG, HDL,  CHOLHDL, LDLCALC, LDLDIRECT  Physical Exam:    VS:  BP (!) 104/56   Pulse (!) 56   Ht 5\' 9"  (1.753 m)   Wt 139 lb 12.8 oz (63.4 kg)   BMI 20.64 kg/m     Wt Readings from Last 3 Encounters:  03/13/18 139 lb 12.8 oz (63.4 kg)  02/27/18 141 lb 1.9 oz (64 kg)  07/18/15 156 lb 11.2 oz (71.1 kg)     Physical Exam  Constitutional: He is oriented to person, place, and time. He appears well-developed and well-nourished. No distress.  HENT:  Head: Normocephalic and atraumatic.  Eyes: No scleral icterus.  Neck: No thyromegaly present.  Cardiovascular: Normal rate and regular rhythm.  No murmur heard. Pulmonary/Chest: Effort normal. He has no rales.  Abdominal: Soft.  Musculoskeletal: He exhibits edema (1-2+ bilat LE edema).  Lymphadenopathy:    He has no cervical adenopathy.  Neurological: He is alert and oriented to  person, place, and time.  Skin: Skin is warm and dry.  Psychiatric: He has a normal mood and affect.    ASSESSMENT & PLAN:    Chronic systolic CHF (congestive heart failure) (HCC) EF has been 40-50 over the years.  Etiology of his cardiomyopathy is not clear.  However, given his advanced age, I would not recommend pursuing ischemic evaluation unless he has symptoms.  His volume has improved since his last visit.  He has had some dizziness but this sounds more like vertigo.  I will obtain a BMET today.  If he is showing evidence of prerenal azotemia, I will cut back on his Lasix dose.  Bilateral lower extremity edema He has chronic lower extremity swelling.  This is likely related to chronic thrombophlebitis in addition to heart failure.  At this point, I am not certain that vascular surgery would have much to offer him.  Permanent atrial fibrillation Continue anticoagulation with warfarin.  Cardiac pacemaker in situ He has been referred to electrophysiology for follow-up.  History of DVT (deep vein thrombosis)  Continue chronic warfarin.   Dispo:  Return in  about 6 months (around 09/12/2018) for Routine Follow Up with Dr. Angelena Form, or Richardson Dopp, PA-C.   Medication Adjustments/Labs and Tests Ordered: Current medicines are reviewed at length with the patient today.  Concerns regarding medicines are outlined above.  Tests Ordered: Orders Placed This Encounter  Procedures  . Basic metabolic panel   Medication Changes: No orders of the defined types were placed in this encounter.   Signed, Richardson Dopp, PA-C  03/13/2018 1:39 PM    [facility] [address], [place], [place]  [address] Phone: [phone]; Fax: [phone]

## 2018-03-13 NOTE — Patient Instructions (Signed)
Description   Continue taking 1/2 tablet daily except 1 tablet on Sundays and Wednesdays.  Recheck in 1 week.

## 2018-03-14 LAB — BASIC METABOLIC PANEL
BUN/Creatinine Ratio: 11 (ref 10–24)
BUN: 11 mg/dL (ref 10–36)
CHLORIDE: 96 mmol/L (ref 96–106)
CO2: 29 mmol/L (ref 20–29)
Calcium: 9.6 mg/dL (ref 8.6–10.2)
Creatinine, Ser: 0.99 mg/dL (ref 0.76–1.27)
GFR calc Af Amer: 77 mL/min/{1.73_m2} (ref >59)
GFR calc non Af Amer: 66 mL/min/{1.73_m2} (ref >59)
GLUCOSE: 66 mg/dL (ref 65–99)
POTASSIUM: 4 mmol/L (ref 3.5–5.2)
SODIUM: 141 mmol/L (ref 134–144)

## 2018-03-20 ENCOUNTER — Ambulatory Visit (INDEPENDENT_AMBULATORY_CARE_PROVIDER_SITE_OTHER): Payer: Medicare Other | Admitting: *Deleted

## 2018-03-20 DIAGNOSIS — I4892 Unspecified atrial flutter: Secondary | ICD-10-CM

## 2018-03-20 DIAGNOSIS — Z7901 Long term (current) use of anticoagulants: Secondary | ICD-10-CM | POA: Diagnosis not present

## 2018-03-20 DIAGNOSIS — I2699 Other pulmonary embolism without acute cor pulmonale: Secondary | ICD-10-CM

## 2018-03-20 LAB — POCT INR: INR: 1.5 — AB (ref 2.0–3.0)

## 2018-03-20 NOTE — Patient Instructions (Signed)
Description   Today take 1.5 tablets, tomorrow take 1 tablet, then Continue taking 1/2 tablet daily except 1 tablet on Sundays and Wednesdays.  Recheck in 1 week.

## 2018-03-26 ENCOUNTER — Ambulatory Visit (INDEPENDENT_AMBULATORY_CARE_PROVIDER_SITE_OTHER): Payer: Medicare Other | Admitting: *Deleted

## 2018-03-26 ENCOUNTER — Encounter: Payer: Self-pay | Admitting: Cardiology

## 2018-03-26 ENCOUNTER — Ambulatory Visit (INDEPENDENT_AMBULATORY_CARE_PROVIDER_SITE_OTHER): Payer: Medicare Other | Admitting: Cardiology

## 2018-03-26 VITALS — BP 126/72 | HR 90 | Ht 69.0 in | Wt 136.0 lb

## 2018-03-26 DIAGNOSIS — I2699 Other pulmonary embolism without acute cor pulmonale: Secondary | ICD-10-CM

## 2018-03-26 DIAGNOSIS — Z7901 Long term (current) use of anticoagulants: Secondary | ICD-10-CM | POA: Diagnosis not present

## 2018-03-26 DIAGNOSIS — I4821 Permanent atrial fibrillation: Secondary | ICD-10-CM

## 2018-03-26 DIAGNOSIS — I10 Essential (primary) hypertension: Secondary | ICD-10-CM | POA: Diagnosis not present

## 2018-03-26 DIAGNOSIS — I4892 Unspecified atrial flutter: Secondary | ICD-10-CM | POA: Diagnosis not present

## 2018-03-26 LAB — POCT INR: INR: 2.4 (ref 2.0–3.0)

## 2018-03-26 MED ORDER — MECLIZINE HCL 12.5 MG PO TABS: 12.5000 mg | ORAL_TABLET | Freq: Two times a day (BID) | ORAL | 11 refills | Status: DC

## 2018-03-26 NOTE — Patient Instructions (Signed)
Medication Instructions:  Your physician has recommended you make the following change in your medication:  1. START Meclizine 12.5 mg twice a day  If you need a refill on your cardiac medications before your next appointment, please call your pharmacy.   Lab work: None ordered  Testing/Procedures: None ordered  Follow-Up: Remote monitoring is used to monitor your Pacemaker from home. This monitoring reduces the number of office visits required to check your device to one time per year. It allows [place] to keep an eye on the functioning of your device to ensure it is working properly. You are scheduled for a device check from home on 06/25/2017. You may send your transmission at any time that day. If you have a wireless device, the transmission will be sent automatically. After your physician reviews your transmission, you will receive a postcard with your next transmission date.  At [facility], you and your health needs are our priority.  As part of our continuing mission to provide you with exceptional heart care, we have created designated Provider Care Teams.  These Care Teams include your primary Cardiologist (physician) and Advanced Practice Providers (APPs -  Physician Assistants and Nurse Practitioners) who all work together to provide you with the care you need, when you need it. You will need a follow up appointment in 1 year.  Please call our office 2 months in advance to schedule this appointment.  You may see Dr. Curt Bears or one of the following Advanced Practice Providers on your designated Care Team:   Chanetta Marshall, NP . Tommye Standard, PA-C  Any Other Special Instructions Will Be Listed Below (If Applicable).  Meclizine tablets or capsules What is this medicine? MECLIZINE (MEK li zeen) is an antihistamine. It is used to prevent nausea, vomiting, or dizziness caused by motion sickness. It is also used to prevent and treat vertigo (extreme dizziness or a feeling that you or your  surroundings are tilting or spinning around). This medicine may be used for other purposes; ask your health care provider or pharmacist if you have questions. COMMON BRAND NAME(S): Antivert, Dramamine Less Drowsy, Medivert, Meni-D What should I tell my health care provider before I take this medicine? They need to know if you have any of these conditions: -glaucoma -lung or breathing disease, like asthma -problems urinating -prostate disease -stomach or intestine problems -an unusual or allergic reaction to meclizine, other medicines, foods, dyes, or preservatives -pregnant or trying to get pregnant -breast-feeding How should I use this medicine? Take this medicine by mouth with a glass of water. Follow the directions on the prescription label. If you are using this medicine to prevent motion sickness, take the dose at least 1 hour before travel. If it upsets your stomach, take it with food or milk. Take your doses at regular intervals. Do not take your medicine more often than directed. Talk to your pediatrician regarding the use of this medicine in children. Special care may be needed. Overdosage: If you think you have taken too much of this medicine contact a poison control center or emergency room at once. NOTE: This medicine is only for you. Do not share this medicine with others. What if I miss a dose? If you miss a dose, take it as soon as you can. If it is almost time for your next dose, take only that dose. Do not take double or extra doses. What may interact with this medicine? Do not take this medicine with any of the following medications: -MAOIs  like Carbex, Eldepryl, Marplan, Nardil, and Parnate This medicine may also interact with the following medications: -alcohol -antihistamines for allergy, cough and cold -certain medicines for anxiety or sleep -certain medicines for depression, like amitriptyline, fluoxetine, sertraline -certain medicines for seizures like  phenobarbital, primidone -general anesthetics like halothane, isoflurane, methoxyflurane, propofol -local anesthetics like lidocaine, pramoxine, tetracaine -medicines that relax muscles for surgery -narcotic medicines for pain -phenothiazines like chlorpromazine, mesoridazine, prochlorperazine, thioridazine This list may not describe all possible interactions. Give your health care provider a list of all the medicines, herbs, non-prescription drugs, or dietary supplements you use. Also tell them if you smoke, drink alcohol, or use illegal drugs. Some items may interact with your medicine. What should I watch for while using this medicine? Tell your doctor or healthcare professional if your symptoms do not start to get better or if they get worse. You may get drowsy or dizzy. Do not drive, use machinery, or do anything that needs mental alertness until you know how this medicine affects you. Do not stand or sit up quickly, especially if you are an older patient. This reduces the risk of dizzy or fainting spells. Alcohol may interfere with the effect of this medicine. Avoid alcoholic drinks. Your mouth may get dry. Chewing sugarless gum or sucking hard candy, and drinking plenty of water may help. Contact your doctor if the problem does not go away or is severe. This medicine may cause dry eyes and blurred vision. If you wear contact lenses you may feel some discomfort. Lubricating drops may help. See your eye doctor if the problem does not go away or is severe. What side effects may I notice from receiving this medicine? Side effects that you should report to your doctor or health care professional as soon as possible: -feeling faint or lightheaded, falls -fast, irregular heartbeat Side effects that usually do not require medical attention (report to your doctor or health care professional if they continue or are bothersome): -constipation -headache -trouble passing urine or change in the amount of  urine -trouble sleeping -upset stomach This list may not describe all possible side effects. Call your doctor for medical advice about side effects. You may report side effects to FDA at 1-800-FDA-1088. Where should I keep my medicine? Keep out of the reach of children. Store at room temperature between 15 and 30 degrees C (59 and 86 degrees F). Keep container tightly closed. Throw away any unused medicine after the expiration date. NOTE: This sheet is a summary. It may not cover all possible information. If you have questions about this medicine, talk to your doctor, pharmacist, or health care provider.  2018 Elsevier/Gold Standard (2015-06-30 19:41:02)

## 2018-03-26 NOTE — Patient Instructions (Addendum)
Description   Continue taking 1/2 tablet daily except 1 tablet on Sundays and Wednesdays.  Recheck in 2 weeks. Call us with any medication changes or any concerns [phone] Coumadin Clinic, Main [phone].

## 2018-03-26 NOTE — Progress Notes (Signed)
Electrophysiology Office Note   Date:  03/26/2018   ID:  Jakarie, Pember Nov 26, 1924, MRN [id number]  PCP:  System, Pcp Not In  Cardiologist:  [place] Primary Electrophysiologist:  Hedwig Mcfall Meredith Leeds, MD    No chief complaint on file.    History of Present Illness: SALOME COZBY is a 84 y.o. male who is being seen today for the evaluation of atrial fibrillation at the request of Richardson Dopp. Presenting today for electrophysiology evaluation.  He has a history of atrial fibrillation and atrial flutter, chronic systolic heart failure, DVTs, hypertension, pulmonary embolism, and has a Medtronic pacemaker.  His pacemaker was implanted in 2010 in [place].  He he is currently on Coumadin.  Echo in 2017 showed an EF of 50% with mild biatrial enlargement.  He also had moderate mitral regurgitation.  Today, he denies symptoms of palpitations, chest pain, shortness of breath, orthopnea, PND, lower extremity edema, claudication,  presyncope, syncope, bleeding, or neurologic sequela. The patient is tolerating medications without difficulties.  His main complaint today is of dizziness.  He is dizzy most of the time.  He has had a few falls with rib pain.  This does not occur when he changes position.  It can happen when he is sitting down.  He Ghada Abbett discuss this with his primary physician.   Past Medical History:  Diagnosis Date  . Atrial fibrillation and flutter ([place])   . Brain tumor (benign) ([place])   . Chronic systolic CHF (congestive heart failure) ([place])    Echo 5/17 [facility] in [place], [place]):  EF 50, mild BAE, moderate MR, moderate to severe TR, RVSP 40.6  . History of recurrent deep vein thrombosis (DVT)    hx of pulmonary embolism // recurrent DVT/PE on Apixaban in 2017 // now on Warfarin  . Hypertension   . Leukemia, chronic lymphocytic ([place])   . Pacemaker   . Pulmonary embolism [facility])    Past Surgical History:  Procedure Laterality Date  . APPENDECTOMY    .  cataracts    . HERNIA REPAIR       Current Outpatient Medications  Medication Sig Dispense Refill  . acetaminophen (TYLENOL) 500 MG tablet Take 1,000 mg by mouth every 6 (six) hours as needed (pain).    . ALLOPURINOL PO Take by mouth as directed.    . furosemide (LASIX) 40 MG tablet Take 1 tablet (40 mg total) by mouth daily. 90 tablet 3  . HYDROcodone-Acetaminophen (NORCO PO) Take by mouth as directed.    . metoprolol tartrate (LOPRESSOR) 25 MG tablet Take 12.5 mg by mouth 2 (two) times daily.    . Multiple Vitamin (MULTIVITAMIN) tablet Take 1 tablet by mouth daily.    Marland Kitchen OVER THE COUNTER MEDICATION Sodium Chloride 2 sprays each nostrle BID    . potassium chloride SA (KLOR-CON M20) 20 MEQ tablet Take 1 tablet (20 mEq total) by mouth daily. 90 tablet 3  . senna (SENOKOT) 8.6 MG tablet Take 1 tablet by mouth daily.    Marland Kitchen warfarin (COUMADIN) 5 MG tablet TAKE ONE TABLET BY MOUTH ON SUNDAY AND WEDNESDAY AND TAKE ONE HALF TABLET BY MOUTH ALL OTHER DAY  0   No current facility-administered medications for this visit.     Allergies:   Iodine; Ivp dye [iodinated diagnostic agents]; Lisinopril; Losartan; and Zocor [simvastatin]   Social History:  The patient  reports that he has never smoked. He has never used smokeless tobacco. He reports that he drinks alcohol.  He reports that he does not use drugs.   Family History:  The patient's family history includes CAD in his mother; Peripheral vascular disease in his mother.    ROS:  Please see the history of present illness.   Otherwise, review of systems is positive for leg swelling, shortness of breath, fatigue, joint swelling, balance problems, headaches, dizziness.   All other systems are reviewed and negative.    PHYSICAL EXAM: VS:  BP 126/72   Pulse 90   Ht 5\' 9"  (1.753 m)   Wt 136 lb (61.7 kg)   BMI 20.08 kg/m  , BMI Body mass index is 20.08 kg/m. GEN: Well nourished, well developed, in no acute distress  HEENT: normal  Neck: no JVD,  carotid bruits, or masses Cardiac: iRRR; no murmurs, rubs, or gallops,no edema  Respiratory:  clear to auscultation bilaterally, normal work of breathing GI: soft, nontender, nondistended, + BS MS: no deformity or atrophy  Skin: warm and dry, device pocket is well healed Neuro:  Strength and sensation are intact Psych: euthymic mood, full affect  EKG:  EKG is not ordered today. Personal review of the ekg ordered 02/27/18 shows atrial flutter, ventricular paced  Device interrogation is reviewed today in detail.  See PaceArt for details.   Recent Labs: 02/27/2018: ALT <5; Hemoglobin 12.3; NT-Pro BNP 1,287; Platelets 271; TSH 1.540 03/13/2018: BUN 11; Creatinine, Ser 0.99; Potassium 4.0; Sodium 141    Lipid Panel  No results found for: CHOL, TRIG, HDL, CHOLHDL, VLDL, LDLCALC, LDLDIRECT   Wt Readings from Last 3 Encounters:  03/26/18 136 lb (61.7 kg)  03/13/18 139 lb 12.8 oz (63.4 kg)  02/27/18 141 lb 1.9 oz (64 kg)      Other studies Reviewed: Additional studies/ records that were reviewed today include: TTE 2017  Review of the above records today demonstrates:  - Left ventricle: LV is diffusely hypokenetic, worse at the apex.   The cavity size was normal. Wall thickness was normal. Systolic   function was mildly to moderately reduced. The estimated ejection   fraction was in the range of 40% to 45%. - Mitral valve: There was mild regurgitation. - Right ventricle: The cavity size was mildly dilated. Systolic   function was mildly reduced. - Right atrium: The atrium was mildly dilated. - Pulmonary arteries: PA peak pressure: 40 mm Hg (S).   ASSESSMENT AND PLAN:  1.  Permanent atrial fibrillation: Currently anticoagulated with warfarin.  A Medtronic dual-chamber pacemaker that we have switched to VVIR today.  Device is functioning appropriately.  He has no major complaints.  This patients CHA2DS2-VASc Score and unadjusted Ischemic Stroke Rate (% per year) is equal to 3.2 %  stroke rate/year from a score of 3  Above score calculated as 1 point each if present [CHF, HTN, DM, Vascular=MI/PAD/Aortic Plaque, Age if 65-74, or Male] Above score calculated as 2 points each if present [Age > 75, or Stroke/TIA/TE]  2.  Hypertension: Well-controlled today.  No changes.  3.  DVTs: Continue warfarin  4.  Dizziness: This does not appear to be cardiac in nature.  I have recommended that he goes to speak with his primary physician about this.  He has fallen in the past.  We Elanora Quin start him on meclizine.  Current medicines are reviewed at length with the patient today.   The patient does not have concerns regarding his medicines.  The following changes were made today: Meclizine  Labs/ tests ordered today include:  No orders of the defined  types were placed in this encounter.    Disposition:   FU with Festus Pursel 1 year  Signed, Sherald Balbuena Meredith Leeds, MD  03/26/2018 10:31 AM     [facility] [address] [place] [address] [phone] (office) [phone] (fax)

## 2018-03-29 LAB — CUP PACEART INCLINIC DEVICE CHECK
Battery Impedance: 1219 Ohm
Battery Remaining Longevity: 53 mo
Battery Voltage: 2.77 V
Implantable Lead Implant Date: 20100917
Implantable Lead Implant Date: 20100917
Implantable Lead Location: 753859
Implantable Pulse Generator Implant Date: 20100917
Lead Channel Impedance Value: 406 Ohm
Lead Channel Impedance Value: 504 Ohm
Lead Channel Pacing Threshold Amplitude: 0.5 V
Lead Channel Sensing Intrinsic Amplitude: 11.2 mV
Lead Channel Sensing Intrinsic Amplitude: 2 mV
Lead Channel Setting Pacing Amplitude: 2 V
MDC IDC LEAD LOCATION: 753860
MDC IDC MSMT LEADCHNL RV PACING THRESHOLD AMPLITUDE: 0.5 V
MDC IDC MSMT LEADCHNL RV PACING THRESHOLD PULSEWIDTH: 0.4 ms
MDC IDC MSMT LEADCHNL RV PACING THRESHOLD PULSEWIDTH: 0.4 ms
MDC IDC SESS DTM: 20191015132156
MDC IDC SET LEADCHNL RV PACING PULSEWIDTH: 0.4 ms
MDC IDC SET LEADCHNL RV SENSING SENSITIVITY: 4 mV
MDC IDC STAT BRADY RV PERCENT PACED: 82 %

## 2018-04-10 ENCOUNTER — Ambulatory Visit (INDEPENDENT_AMBULATORY_CARE_PROVIDER_SITE_OTHER): Payer: Medicare Other | Admitting: *Deleted

## 2018-04-10 ENCOUNTER — Encounter (INDEPENDENT_AMBULATORY_CARE_PROVIDER_SITE_OTHER): Payer: Self-pay

## 2018-04-10 DIAGNOSIS — I4892 Unspecified atrial flutter: Secondary | ICD-10-CM | POA: Diagnosis not present

## 2018-04-10 DIAGNOSIS — I2699 Other pulmonary embolism without acute cor pulmonale: Secondary | ICD-10-CM

## 2018-04-10 DIAGNOSIS — Z7901 Long term (current) use of anticoagulants: Secondary | ICD-10-CM

## 2018-04-10 LAB — POCT INR: INR: 1.9 — AB (ref 2.0–3.0)

## 2018-04-10 NOTE — Patient Instructions (Signed)
Description   Today take 1.5 tablets then continue taking 1/2 tablet daily except 1 tablet on Sundays and Wednesdays.  Recheck in 2 weeks. Call us with any medication changes or any concerns [phone] Cou[facility]in [facility], Main [phone].

## 2018-04-23 ENCOUNTER — Ambulatory Visit (INDEPENDENT_AMBULATORY_CARE_PROVIDER_SITE_OTHER): Payer: Medicare Other | Admitting: *Deleted

## 2018-04-23 DIAGNOSIS — Z7901 Long term (current) use of anticoagulants: Secondary | ICD-10-CM

## 2018-04-23 DIAGNOSIS — I4892 Unspecified atrial flutter: Secondary | ICD-10-CM

## 2018-04-23 DIAGNOSIS — I2699 Other pulmonary embolism without acute cor pulmonale: Secondary | ICD-10-CM

## 2018-04-23 LAB — POCT INR: INR: 3.6 — AB (ref 2.0–3.0)

## 2018-04-23 NOTE — Patient Instructions (Signed)
Description   Do not take any Coumadin today then continue taking 1/2 tablet daily except 1 tablet on Sundays and Wednesdays.  Recheck in 2 weeks. Call us with any medication changes or any concerns [phone] Coumadin Clinic, Main [phone].

## 2018-04-27 DIAGNOSIS — E860 Dehydration: Secondary | ICD-10-CM | POA: Diagnosis not present

## 2018-04-27 DIAGNOSIS — I959 Hypotension, unspecified: Secondary | ICD-10-CM | POA: Diagnosis not present

## 2018-04-27 DIAGNOSIS — R531 Weakness: Secondary | ICD-10-CM | POA: Diagnosis not present

## 2018-04-27 DIAGNOSIS — R5383 Other fatigue: Secondary | ICD-10-CM | POA: Diagnosis not present

## 2018-04-27 DIAGNOSIS — R42 Dizziness and giddiness: Secondary | ICD-10-CM | POA: Diagnosis not present

## 2018-04-27 DIAGNOSIS — R0902 Hypoxemia: Secondary | ICD-10-CM | POA: Diagnosis not present

## 2018-04-27 DIAGNOSIS — R61 Generalized hyperhidrosis: Secondary | ICD-10-CM | POA: Diagnosis not present

## 2018-04-27 DIAGNOSIS — R55 Syncope and collapse: Secondary | ICD-10-CM | POA: Diagnosis not present

## 2018-04-28 DIAGNOSIS — R42 Dizziness and giddiness: Secondary | ICD-10-CM | POA: Diagnosis not present

## 2018-04-28 DIAGNOSIS — R531 Weakness: Secondary | ICD-10-CM | POA: Diagnosis not present

## 2018-04-28 DIAGNOSIS — R5383 Other fatigue: Secondary | ICD-10-CM | POA: Diagnosis not present

## 2018-05-07 ENCOUNTER — Ambulatory Visit (INDEPENDENT_AMBULATORY_CARE_PROVIDER_SITE_OTHER): Payer: Medicare Other

## 2018-05-07 DIAGNOSIS — Z7901 Long term (current) use of anticoagulants: Secondary | ICD-10-CM

## 2018-05-07 DIAGNOSIS — I4892 Unspecified atrial flutter: Secondary | ICD-10-CM | POA: Diagnosis not present

## 2018-05-07 DIAGNOSIS — I2699 Other pulmonary embolism without acute cor pulmonale: Secondary | ICD-10-CM

## 2018-05-07 LAB — POCT INR: INR: 2.7 (ref 2.0–3.0)

## 2018-05-07 NOTE — Patient Instructions (Signed)
Please continue taking 1/2 tablet daily except 1 tablet on Sundays and Wednesdays.  Recheck in 3 weeks. Call us with any medication changes or any concerns [phone] [facility] [phone].

## 2018-05-21 ENCOUNTER — Other Ambulatory Visit: Payer: Self-pay

## 2018-05-21 MED ORDER — WARFARIN SODIUM 5 MG PO TABS: ORAL_TABLET | ORAL | 2 refills | Status: DC

## 2018-05-28 ENCOUNTER — Ambulatory Visit (INDEPENDENT_AMBULATORY_CARE_PROVIDER_SITE_OTHER): Payer: Medicare Other | Admitting: *Deleted

## 2018-05-28 DIAGNOSIS — I4892 Unspecified atrial flutter: Secondary | ICD-10-CM

## 2018-05-28 DIAGNOSIS — Z7901 Long term (current) use of anticoagulants: Secondary | ICD-10-CM | POA: Diagnosis not present

## 2018-05-28 DIAGNOSIS — I2699 Other pulmonary embolism without acute cor pulmonale: Secondary | ICD-10-CM

## 2018-05-28 LAB — POCT INR: INR: 2.8 (ref 2.0–3.0)

## 2018-05-28 NOTE — Patient Instructions (Signed)
Description   Please continue taking 1/2 tablet daily except 1 tablet on Sundays and Wednesdays.  Recheck in 4 weeks. Call us with any medication changes or any concerns [phone] [facility] [phone].

## 2018-06-25 ENCOUNTER — Ambulatory Visit (INDEPENDENT_AMBULATORY_CARE_PROVIDER_SITE_OTHER): Payer: Medicare Other | Admitting: Pharmacist

## 2018-06-25 DIAGNOSIS — I2699 Other pulmonary embolism without acute cor pulmonale: Secondary | ICD-10-CM

## 2018-06-25 DIAGNOSIS — I4892 Unspecified atrial flutter: Secondary | ICD-10-CM

## 2018-06-25 DIAGNOSIS — Z7901 Long term (current) use of anticoagulants: Secondary | ICD-10-CM

## 2018-06-25 LAB — POCT INR: INR: 1.8 — AB (ref 2.0–3.0)

## 2018-06-25 NOTE — Patient Instructions (Signed)
Description   Take 1 tablet today then continue taking 1/2 tablet daily except 1 tablet on Sundays and Wednesdays.  Recheck in 3 weeks. Call us with any medication changes or any concerns [phone] [facility] [phone].

## 2018-06-27 ENCOUNTER — Telehealth: Payer: Self-pay

## 2018-06-27 NOTE — Telephone Encounter (Signed)
Left message for patient to remind of missed remote transmission.  

## 2018-06-28 ENCOUNTER — Encounter: Payer: Self-pay | Admitting: Cardiology

## 2018-07-16 ENCOUNTER — Ambulatory Visit (INDEPENDENT_AMBULATORY_CARE_PROVIDER_SITE_OTHER): Payer: Medicare Other

## 2018-07-16 DIAGNOSIS — Z7901 Long term (current) use of anticoagulants: Secondary | ICD-10-CM

## 2018-07-16 DIAGNOSIS — I4892 Unspecified atrial flutter: Secondary | ICD-10-CM

## 2018-07-16 DIAGNOSIS — I2699 Other pulmonary embolism without acute cor pulmonale: Secondary | ICD-10-CM

## 2018-07-16 LAB — POCT INR: INR: 3.6 — AB (ref 2.0–3.0)

## 2018-07-16 NOTE — Patient Instructions (Signed)
Please skip coumadin tonight, take 1/2 tablet tomorrow, then continue taking 1/2 tablet daily except 1 tablet on Sundays and Wednesdays.  Recheck in 2 weeks. Call us with any medication changes or any concerns [phone] [facility] [phone].

## 2018-07-18 ENCOUNTER — Ambulatory Visit (INDEPENDENT_AMBULATORY_CARE_PROVIDER_SITE_OTHER): Payer: Medicare Other

## 2018-07-18 DIAGNOSIS — I4821 Permanent atrial fibrillation: Secondary | ICD-10-CM | POA: Diagnosis not present

## 2018-07-18 DIAGNOSIS — I4892 Unspecified atrial flutter: Secondary | ICD-10-CM

## 2018-07-21 LAB — CUP PACEART REMOTE DEVICE CHECK
Battery Impedance: 1337 Ohm
Date Time Interrogation Session: 20200206201916
Implantable Lead Implant Date: 20100917
Implantable Lead Implant Date: 20100917
Implantable Lead Location: 753860
Implantable Lead Model: 4076
Lead Channel Impedance Value: 67 Ohm
Lead Channel Pacing Threshold Pulse Width: 0.4 ms
Lead Channel Setting Pacing Pulse Width: 0.46 ms
Lead Channel Setting Sensing Sensitivity: 2.8 mV
MDC IDC LEAD LOCATION: 753859
MDC IDC MSMT BATTERY REMAINING LONGEVITY: 65 mo
MDC IDC MSMT BATTERY VOLTAGE: 2.77 V
MDC IDC MSMT LEADCHNL RV IMPEDANCE VALUE: 431 Ohm
MDC IDC MSMT LEADCHNL RV PACING THRESHOLD AMPLITUDE: 0.375 V
MDC IDC PG IMPLANT DT: 20100917
MDC IDC SET LEADCHNL RV PACING AMPLITUDE: 2 V
MDC IDC STAT BRADY RV PERCENT PACED: 27 %

## 2018-07-29 NOTE — Progress Notes (Signed)
Remote pacemaker transmission.   

## 2018-08-06 ENCOUNTER — Ambulatory Visit (INDEPENDENT_AMBULATORY_CARE_PROVIDER_SITE_OTHER): Payer: Medicare Other | Admitting: *Deleted

## 2018-08-06 DIAGNOSIS — I2699 Other pulmonary embolism without acute cor pulmonale: Secondary | ICD-10-CM | POA: Diagnosis not present

## 2018-08-06 DIAGNOSIS — Z7901 Long term (current) use of anticoagulants: Secondary | ICD-10-CM | POA: Diagnosis not present

## 2018-08-06 DIAGNOSIS — I4892 Unspecified atrial flutter: Secondary | ICD-10-CM | POA: Diagnosis not present

## 2018-08-06 LAB — POCT INR: INR: 4.4 — AB (ref 2.0–3.0)

## 2018-08-06 NOTE — Patient Instructions (Addendum)
  Description   Please do not take coumadin tonight and do not take tomorrow's dose of Coumadin then start taking 1/2 tablet daily except 1 tablet on Wednesdays.  Recheck in 2 weeks. Call us with any medication changes or any concerns [phone] [facility] [phone].

## 2018-08-20 ENCOUNTER — Ambulatory Visit (INDEPENDENT_AMBULATORY_CARE_PROVIDER_SITE_OTHER): Payer: Medicare Other | Admitting: *Deleted

## 2018-08-20 DIAGNOSIS — Z7901 Long term (current) use of anticoagulants: Secondary | ICD-10-CM

## 2018-08-20 DIAGNOSIS — I2699 Other pulmonary embolism without acute cor pulmonale: Secondary | ICD-10-CM | POA: Diagnosis not present

## 2018-08-20 DIAGNOSIS — I4892 Unspecified atrial flutter: Secondary | ICD-10-CM | POA: Diagnosis not present

## 2018-08-20 LAB — POCT INR: INR: 2.7 (ref 2.0–3.0)

## 2018-08-20 NOTE — Patient Instructions (Signed)
Description   Continue taking 1/2 tablet daily except 1 tablet on Wednesdays and Sundays.  Recheck in 3 weeks. Call us with any medication changes or any concerns [phone] [facility] [phone].

## 2018-09-09 ENCOUNTER — Telehealth: Payer: Self-pay | Admitting: *Deleted

## 2018-09-09 NOTE — Telephone Encounter (Signed)

## 2018-09-10 ENCOUNTER — Other Ambulatory Visit: Payer: Self-pay

## 2018-09-10 ENCOUNTER — Ambulatory Visit (INDEPENDENT_AMBULATORY_CARE_PROVIDER_SITE_OTHER): Payer: Medicare Other | Admitting: Pharmacist

## 2018-09-10 DIAGNOSIS — I4892 Unspecified atrial flutter: Secondary | ICD-10-CM

## 2018-09-10 DIAGNOSIS — Z7901 Long term (current) use of anticoagulants: Secondary | ICD-10-CM | POA: Diagnosis not present

## 2018-09-10 DIAGNOSIS — I2699 Other pulmonary embolism without acute cor pulmonale: Secondary | ICD-10-CM | POA: Diagnosis not present

## 2018-09-10 LAB — POCT INR: INR: 1.8 — AB (ref 2.0–3.0)

## 2018-09-18 ENCOUNTER — Telehealth: Payer: Self-pay | Admitting: Physician Assistant

## 2018-09-18 NOTE — Telephone Encounter (Signed)
Called pt JL:LVDI 09/26/2018.  See message below. I left pt a message to call back.

## 2018-09-18 NOTE — Telephone Encounter (Signed)
   Chart Scrub Note:  - Patient is appropriate to be changed to virtual office visit at this time (6 mo f/u) - Please call pt to switch him - clarify phone vs video - Please get him signed up for MyChart - I have since decided to use Doximity video dialer instead of Webex which merely involves the patient receiving a text at the time of their visit to launch the video (must be on a smartphone) The workflow would be for my assist to call him on the phone 15 mins prior to visit to get vitals and meds, then I will send him a text via mobile phone to launch the video. If he decides to proceed with video visit, please let him know the plan  Melina Copa PA-C

## 2018-09-19 NOTE — Telephone Encounter (Signed)
Call placed to pt re: appt 09/26/2018.  Spoke with wife, [place], [place] on file. She has been made aware that pt will receive a Doximity video call from provider. She has been made aware that she will receive a text right before his apt and she will open it up and click on the link and get connected.  Wife agrees and thanks me for the instructions.

## 2018-09-24 ENCOUNTER — Encounter: Payer: Self-pay | Admitting: Physician Assistant

## 2018-09-24 NOTE — Telephone Encounter (Signed)
Virtual Visit Pre-Appointment Phone Call  Steps For Call:  1. Confirm consent - "In the setting of the current Covid19 crisis, you are scheduled for a (phone or video) visit with your provider on (date) at (time).  Just as we do with many in-office visits, in order for you to participate in this visit, we must obtain consent.  If you'd like, I can send this to your mychart (if signed up) or email for you to review.  Otherwise, I can obtain your verbal consent now.  All virtual visits are billed to your insurance company just like a normal visit would be.  By agreeing to a virtual visit, we'd like you to understand that the technology does not allow for your provider to perform an examination, and thus may limit your provider's ability to fully assess your condition.  Finally, though the technology is pretty good, we cannot assure that it will always work on either your or our end, and in the setting of a video visit, we may have to convert it to a phone-only visit.  In either situation, we cannot ensure that we have a secure connection.  Are you willing to proceed?" STAFF: Did the patient verbally acknowledge consent to telehealth visit? yesDocument YES/NOyes  2. Confirm the BEST phone number to call the day of the visit: number on file  3. Give patient instructions for WebEx/MyChart download to smartphone as below or Doximity/Doxy.me if video visit (depending on what platform provider is using)  4. Advise patient to be prepared with any vital sign or heart rhythm information, their current medicines, and a piece of paper and pen handy for any instructions they may receive the day of their visit  5. Inform patient they will receive a phone call 15 minutes prior to their appointment time (may be from unknown caller ID) so they should be prepared to answer  6. Confirm that appointment type is correct in Epic appointment notes (video vs telephone)     TELEPHONE CALL NOTE  Randall Lawrence has  been deemed a candidate for a follow-up tele-health visit to limit community exposure during the Covid-19 pandemic. I spoke with the patient via phone to ensure availability of phone/video source, confirm preferred email & phone number, and discuss instructions and expectations.  I reminded Randall Lawrence to be prepared with any vital sign and/or heart rhythm information that could potentially be obtained via home monitoring, at the time of his visit. I reminded Randall Lawrence to expect a phone call at the time of his visit if his visit.  [name], [place] 09/24/2018 12:23 PM   DOWNLOADING THE WEBEX APP TO SMARTPHONE  - If Apple, ask patient to go to App Store and type in WebEx in the search bar. [place] [employer], the blue/green circle. If Android, go to [employer] and type in [employer] in the search bar. The app is free but as with any other app downloads, their phone may require them to verify saved payment information or Apple/Android password.  - The patient does NOT have to create an account. - On the day of the visit, the assist will walk the patient through joining the meeting with the meeting number/password.  DOWNLOADING THE MYCHART APP TO SMARTPHONE  - If Apple, go to [employer] and type in MyChart in the search bar and download the app. If Android, ask patient to go to [employer] and type in [place] in the search bar  and download the app. The app is free but as with any other app downloads, their phone may require them to verify saved payment information or Apple/Android password.  - The patient will need to then log into the app with their MyChart username and password, and select [facility] as their healthcare provider to link the account. When it is time for your visit, go to the MyChart app, find appointments, and click Begin Video Visit. Be sure to Select Allow for your device to access the Microphone and Camera for your visit. You will then be connected, and  your provider will be with you shortly.  **If they have any issues connecting, or need assistance please contact [place] [phone])**  **If using a computer, in order to ensure the best quality for your visit they will need to use either of the following Internet Browsers: Microsoft [place], or Google Chrome**  [place]   I hereby voluntarily request, consent and authorize [facility] and its employed or contracted physicians, physician assistants, nurse practitioners or other licensed health care professionals (the Practitioner), to provide me with telemedicine health care services (the Services") as deemed necessary by the treating Practitioner. I acknowledge and consent to receive the Services by the Practitioner via telemedicine. I understand that the telemedicine visit will involve communicating with the Practitioner through live audiovisual communication technology and the disclosure of certain medical information by electronic transmission. I acknowledge that I have been given the opportunity to request an in-person assessment or other available alternative prior to the telemedicine visit and am voluntarily participating in the telemedicine visit.  I understand that I have the right to withhold or withdraw my consent to the use of telemedicine in the course of my care at any time, without affecting my right to future care or treatment, and that the Practitioner or I may terminate the telemedicine visit at any time. I understand that I have the right to inspect all information obtained and/or recorded in the course of the telemedicine visit and may receive copies of available information for a reasonable fee.  I understand that some of the potential risks of receiving the Services via telemedicine include:   Delay or interruption in medical evaluation due to technological equipment failure or disruption;  Information transmitted may  not be sufficient (e.g. poor resolution of images) to allow for appropriate medical decision making by the Practitioner; and/or   In rare instances, security protocols could fail, causing a breach of personal health information.  Furthermore, I acknowledge that it is my responsibility to provide information about my medical history, conditions and care that is complete and accurate to the best of my ability. I acknowledge that Practitioner's advice, recommendations, and/or decision may be based on factors not within their control, such as incomplete or inaccurate data provided by me or distortions of diagnostic images or specimens that may result from electronic transmissions. I understand that the practice of medicine is not an exact science and that Practitioner makes no warranties or guarantees regarding treatment outcomes. I acknowledge that I will receive a copy of this consent concurrently upon execution via email to the email address I last provided but may also request a printed copy by calling the office of [facility].    I understand that my insurance will be billed for this visit.   I have read or had this consent read to me.  I understand the contents of this consent, which adequately explains  the benefits and risks of the Services being provided via telemedicine.   I have been provided ample opportunity to ask questions regarding this consent and the Services and have had my questions answered to my satisfaction.  I give my informed consent for the services to be provided through the use of telemedicine in my medical care  By participating in this telemedicine visit I agree to the above.

## 2018-09-24 NOTE — Progress Notes (Addendum)
Virtual Visit via Video Note   This visit type was conducted due to national recommendations for restrictions regarding the COVID-19 Pandemic (e.g. social distancing) in an effort to limit this patient's exposure and mitigate transmission in our community.  Due to his co-morbid illnesses, this patient is at least at moderate risk for complications without adequate follow up.  This format is felt to be most appropriate for this patient at this time.  All issues noted in this document were discussed and addressed.  A limited physical exam was performed with this format.  Please refer to the patient's chart for his consent to telehealth for [facility].   Evaluation Performed:  Follow-up visit  Date:  09/26/2018   ID:  Randall, Lawrence 10-21-1924, MRN [id number]  Patient Location: Home  Provider Location: Home  PCP:  System, Pcp Not In  Cardiologist:  Lauree Chandler, MD  Electrophysiologist:  Constance Haw, MD   Chief Complaint: 6 month f/u atrial fib, CHF  History of Present Illness:    Randall Lawrence is a 85 y.o. male with permanent atrial fibrillation/atrial flutter, pacemaker in 7078, chronic systolic heart failure, CLL, IV dye allergy, hypertension, recurrent DVTs/PEs, alcohol abuse who presents via telehealth for follow-up. His pacemaker was implanted in [place] in 2010. He was previouslyfollowed by Dr.Naresh Jennefer Lawrence in [place], [place]. Records indicate he had a normal heart cath in the past per Richardson Dopp PA-C's note. He was previously on apixaban for atrial fibrillation and DVT/pulmonary embolism. Notes indicate in 2016 he had DVT and PE. In 2017 he was admitted to [facility] while visiting family with syncope and was found to have recurrent DVT and PE. He was switched from Apixaban to Lovenox for a period of time then transitioned to Coumadin. 2D echo 06/2015 had shown EF 40-45%. He followed up with his cardiologist in [facility] and had a repeat echo 10/2015 showing EF 50%,  normal RV, moderate MR, moderate-severe TR, moderate elevation in RVSP, mild LAE/RAE. He had been on amiodarone when seen in May 2019. He then re-established with our office 02/27/18 and has been followed since that time. When [facility] met him he had increased swelling in his legs so lasix was adjusted. Venous duplex was consistent with chronic DVT but no acute DVT. Ischemic evaluation has not been pursued given his advanced age. His last labs 03/2018 showed K 4.0, Cr 0.99, 02/2018 normal TSH, albumin 4.2, Hgb 12.3. He has had dizziness felt unrelated to his cardiac status.  He is seen today via videovisit using doxy.me. He reports he is stable from a cardiac standpoint. He is rather sedentary. His chronic dyspnea on exertion is unchanged and at baseline. He has not had any CP or racing heart. His BP is up a little today but he actually tends to run lower, so occasionally has to hold the metoprolol due to this. His wife is an Therapist, sports and seems to be caring for him well. He does have a history of occasional falls. This has not been severe, but usually due to tipping backwards when using his cane. He does not fall when he uses his walker regularly. His chronic dizziness is unchanged. He admits the meclizine helped but he doesn't take it as often as he should. He is isolating as much as possible at home during the Covid pandemic. He was planning to get established with PCP at [facility] for his dizziness but then that was rescheduled due to the pandemic. He denies needing any  refills. His LEE is currently well controlled. Weight is up several lb - this is a guestimate though, has not been weighing formally. He's eating more during the pandemic but has no symptoms to suggest volume overload. Still drinks 1 pint of bourbon daily with no interest in cutting down at his age.  The patient does not have symptoms concerning for COVID-19 infection (fever, chills, cough, or new shortness of breath).    Past Medical History:  Diagnosis  Date   Alcohol abuse    Allergy to IVP dye    Atrial fibrillation and flutter (HCC)    Brain tumor (benign) (HCC)    Chronic systolic CHF (congestive heart failure) ([facility])    Echo 5/17 [facility] in [place], [place]):  EF 50, mild BAE, moderate MR, moderate to severe TR, RVSP 40.6   History of recurrent deep vein thrombosis (DVT)    hx of pulmonary embolism and DVT in 2016 // recurrent DVT/PE on Apixaban in 2017 // now on Warfarin   Hypertension    Leukemia, chronic lymphocytic (HCC)    Mitral regurgitation    Pacemaker    Pulmonary embolism (HCC)    Tricuspid regurgitation    Past Surgical History:  Procedure Laterality Date   APPENDECTOMY     cataracts     HERNIA REPAIR       Current Meds  Medication Sig   acetaminophen (TYLENOL) 500 MG tablet Take 1,000 mg by mouth every 6 (six) hours as needed (pain).   ALLOPURINOL PO Take 100 mg by mouth daily.    brimonidine (ALPHAGAN) 0.2 % ophthalmic solution    latanoprost (XALATAN) 0.005 % ophthalmic solution    meclizine (ANTIVERT) 12.5 MG tablet Take 1 tablet (12.5 mg total) by mouth 2 (two) times daily.   metoprolol tartrate (LOPRESSOR) 25 MG tablet Take 12.5 mg by mouth 2 (two) times daily as needed.    Multiple Vitamin (MULTIVITAMIN) tablet Take 1 tablet by mouth daily.   OVER THE COUNTER MEDICATION Sodium Chloride 2 sprays each nostrle BID   senna (SENOKOT) 8.6 MG tablet Take 1 tablet by mouth daily as needed.    warfarin (COUMADIN) 5 MG tablet Take as directed by [facility]mad[facility] Clinic     Allergies:   Iodine; Ivp dye [iodinated diagnostic agents]; Lisinopril; Losartan; and Zocor [simvastatin]   Social History   Tobacco Use   Smoking status: Never Smoker   Smokeless tobacco: Never Used  Substance Use Topics   Alcohol use: Yes    Comment: 1 pint bourbon daily (plus )    Drug use: No     Family Hx: The patient's family history includes CAD in his mother; Peripheral vascular disease in his  mother.  ROS:   Please see the history of present illness.    All other systems reviewed and are negative.   Prior CV studies:   The following studies were reviewed today:  Venous Duplex 02/2018 Final Interpretation: Right: Findings consistent with chronic deep vein thrombosis involving the right femoral vein, and right popliteal vein. Findings appear essentially unchanged compared to previous examination. No cystic structure found in the popliteal fossa. Left: Findings consistent with chronic deep vein thrombosis involving the left femoral vein. Findings appear improved from previous examination. No cystic structure found in the popliteal fossa.  Echo 10/2015 [facility] in [place], [place]) EF 50, mild BAE, moderate MR, moderate to severe TR, RVSP 40.6  Echo 07/12/2015 EF 40-45, diff HK (worse at apex), mild MR, mild reduced RVSF,  mild RAE, PASP 40  Carotid US 1/61/0960 LICA 4-54; no disease in RICA  Echo 03/15/2015 [facility] in [place], [place]) EF 45-50, evidence of RV strain, mild MR, moderate TR, RVSP 54  Carotid US 10/16 [facility] in [place], [place]) Normal  V/Q 10/16 [facility] in [place], [place]) Bilateral upper lobe mismatch consistent with pulmonary embolism  Labs/Other Tests and Data Reviewed:    EKG:  An ECG dated 02/27/18 was personally reviewed today and demonstrated v pacing with probable atrial flutter per Dr. Macky Lower report  Recent Labs: 02/27/2018: ALT <5; Hemoglobin 12.3; NT-Pro BNP 1,287; Platelets 271; TSH 1.540 03/13/2018: BUN 11; Creatinine, Ser 0.99; Potassium 4.0; Sodium 141   Recent Lipid Panel No results found for: CHOL, TRIG, HDL, CHOLHDL, LDLCALC, LDLDIRECT  Wt Readings from Last 3 Encounters:  09/26/18 145 lb (65.8 kg)  03/26/18 136 lb (61.7 kg)  03/13/18 139 lb 12.8 oz (63.4 kg)     Objective:    Vital Signs:  BP (!) 148/69    Pulse (!) 55    Ht '5\' 9"'  (1.753 m)    Wt 145 lb (65.8 kg)    BMI 21.41 kg/m     General - elderly cheerful alert AAM in no acute distress, well groomed HEENT - Normocephalic, atraumatic Pulm - No labored breathing, no coughing during visit, no audible wheezing, speaking in full sentences Skin - no obvious skin tears or bruising, some chronic skin thickening of lower extremities Extremities - no obvious lower extremity edema Neuro - A+Ox3, no slurred speech, answers questions appropriately Psych - Pleasant affect    ASSESSMENT & PLAN:    1. Chronic systolic CHF with moderate MR and moderate-severe TR - etiology of cardiomyopathy undetermined but improved to 50% by last echo. Ischemic eval not pursued due to advanced age and lack of anginal symptoms, as well as need for ongoing anticoagulation for his history of AF/AFL and recurrent VTE. I agree with continued conservative management. His prior drop in EF could have been ETOH related as well. Reviewed 2g sodium restriction, 2L fluid restriction, daily weights with patient. He is not on ACEI/ARB due to h/o tendency for hypotension and only takes short acting metoprolol occasionally when his BP will allow. His BP is rarely up like it is today, so they will continue to monitor. 2. Permanent atrial fibrillation and atrial flutter - rate controlled on today's vitals. Continue anticoagulation with caution as he does have a history of falling when using his cane. He reports he does not fall when he uses his walker, though, so I have encouraged him to rely on the walker when he is moving about as the best way to keep him safe. I told him to notify us of any other falling events as we will likely need to continue to weigh risk vs benefit of anticoagulation at each clinical encounter. He also continues to drink a pint of bourbon daily and is not interested in quitting which I relayed increases his risk of life threatening bleeding. Unfortunately it's not just as simple as stopping in setting of atrial fib; he also has indications with  recurrent DVTs and PEs so stopping would come with its own risks. I will review with Dr. Angelena Form to get his input - it does not sound like we can really reconcile a happy medium between risk of continuing vs risk of stopping so since he has tolerated anticoagulation so far, we might just need to hold course. I did review importance  of cutting down alcohol long term and not to go cold [place] given his longstanding dependence. I am doubtful he will comply. Recommend considering follow-up of routine BMET/CBC when he returns for his next OV in the fall if he has not had by primary care between now and then. 3. H/o PPM - followed by Dr. Curt Bears. Continue remote checks, next scheduled 10/2018. 4. Recurrent VTE - continues on Coumadin. Hgb stable in the fall. Would suggest recheck at next in-person OV. No bleeding reported. See above regarding anticoagulation.  COVID-19 Education: The signs and symptoms of COVID-19 were discussed with the patient and how to seek care for testing (follow up with PCP or arrange E-visit).  The importance of social distancing was discussed today.  Time:   Today, I have spent 19 minutes with the patient with telehealth technology discussing the above problems. I spent 10 minutes prepping chart prior to visit.   Medication Adjustments/Labs and Tests Ordered: Current medicines are reviewed at length with the patient today.  Concerns regarding medicines are outlined above.   Disposition:  Follow up in 6 month(s) with Dr. Angelena Form - has not seen his primary cardiologist since 2017. In the future it may be worthwhile for Dr. Angelena Form and Dr. Curt Bears to stagger their in-person visits so that he is seeing one or the other every 6 months.  Signed, Charlie Pitter, PA-C  09/26/2018 2:28 PM    [facility]

## 2018-09-26 ENCOUNTER — Other Ambulatory Visit: Payer: Self-pay

## 2018-09-26 ENCOUNTER — Telehealth (INDEPENDENT_AMBULATORY_CARE_PROVIDER_SITE_OTHER): Payer: Medicare Other | Admitting: Physician Assistant

## 2018-09-26 ENCOUNTER — Encounter: Payer: Self-pay | Admitting: Physician Assistant

## 2018-09-26 VITALS — BP 148/69 | HR 55 | Ht 69.0 in | Wt 145.0 lb

## 2018-09-26 DIAGNOSIS — I429 Cardiomyopathy, unspecified: Secondary | ICD-10-CM

## 2018-09-26 DIAGNOSIS — I5022 Chronic systolic (congestive) heart failure: Secondary | ICD-10-CM | POA: Diagnosis not present

## 2018-09-26 DIAGNOSIS — I4821 Permanent atrial fibrillation: Secondary | ICD-10-CM

## 2018-09-26 DIAGNOSIS — I82409 Acute embolism and thrombosis of unspecified deep veins of unspecified lower extremity: Secondary | ICD-10-CM

## 2018-09-26 DIAGNOSIS — Z950 Presence of cardiac pacemaker: Secondary | ICD-10-CM

## 2018-09-26 NOTE — Patient Instructions (Addendum)
Medication Instructions:  Your physician recommends that you continue on your current medications as directed. Please refer to the Current Medication list given to you today.  If you need a refill on your cardiac medications before your next appointment, please call your pharmacy.   Lab work: None ordered  If you have labs (blood work) drawn today and your tests are completely normal, you will receive your results only by: Marland Kitchen MyChart Message (if you have MyChart) OR . A paper copy in the mail If you have any lab test that is abnormal or we need to change your treatment, we will call you to review the results.  Testing/Procedures: None ordered   Follow-Up: At [facility], you and your health needs are our priority.  As part of our continuing mission to provide you with exceptional heart care, we have created designated Provider Care Teams.  These Care Teams include your primary Cardiologist (physician) and Advanced Practice Providers (APPs -  Physician Assistants and Nurse Practitioners) who all work together to provide you with the care you need, when you need it. You will need a follow up appointment in 6 months.  Please call our office 2 months in advance to schedule this appointment.  You may see Lauree Chandler, MD or one of the following Advanced Practice Providers on your designated Care Team:   [place], PA-C Melina Copa, PA-C . Ermalinda Barrios, PA-C  Any Other Special Instructions Will Be Listed Below (If Applicable).  Make sure to your walker as much as possible to prevent falls.. If you continue to have fall spells, please call our office to let us know.  If you notice any bleeding such as blood in stool, black tarry stools, blood in urine, nosebleeds or any other unusual bleeding, call your doctor immediately. It is not normal to have this kind of bleeding while on a blood thinner and usually indicates there is an underlying problem with one of your body systems  that needs to be checked out.

## 2018-09-27 ENCOUNTER — Encounter: Payer: Self-pay | Admitting: Physician Assistant

## 2018-09-27 NOTE — Progress Notes (Signed)
  In follow-up to yesterday's note, reviewed anticoag plan with Dr. Angelena Form who agrees with cautious continuation for now. Dayna Dunn PA-C

## 2018-10-07 ENCOUNTER — Telehealth: Payer: Self-pay

## 2018-10-07 NOTE — Telephone Encounter (Signed)
lmom for prescreen  

## 2018-10-10 ENCOUNTER — Other Ambulatory Visit: Payer: Self-pay | Admitting: Cardiovascular Disease

## 2018-10-10 ENCOUNTER — Telehealth: Payer: Self-pay

## 2018-10-10 MED ORDER — WARFARIN SODIUM 5 MG PO TABS: ORAL_TABLET | ORAL | 1 refills | Status: DC

## 2018-10-10 NOTE — Telephone Encounter (Signed)
 *  STAT* If patient is at the pharmacy, call can be transferred to refill team.   1. Which medications need to be refilled? (please list name of each medication and dose if known) warfarin (COUMADIN) 5 MG tablet  2. Which pharmacy/location (including street and city if local pharmacy) is medication to be sent to? [facility]  3. Do they need a 30 day or 90 day supply? 30 days

## 2018-10-10 NOTE — Telephone Encounter (Signed)

## 2018-10-10 NOTE — Telephone Encounter (Signed)
Pt is overdue for follow-up, b ut has appt scheduled for 10/11/18.  Will refill rx.

## 2018-10-11 ENCOUNTER — Ambulatory Visit (INDEPENDENT_AMBULATORY_CARE_PROVIDER_SITE_OTHER): Payer: Medicare Other | Admitting: *Deleted

## 2018-10-11 DIAGNOSIS — I2699 Other pulmonary embolism without acute cor pulmonale: Secondary | ICD-10-CM | POA: Diagnosis not present

## 2018-10-11 DIAGNOSIS — I4892 Unspecified atrial flutter: Secondary | ICD-10-CM

## 2018-10-11 DIAGNOSIS — Z7901 Long term (current) use of anticoagulants: Secondary | ICD-10-CM

## 2018-10-11 LAB — POCT INR: INR: 3.6 — AB (ref 2.0–3.0)

## 2018-10-14 ENCOUNTER — Other Ambulatory Visit: Payer: Self-pay

## 2018-10-24 ENCOUNTER — Telehealth: Payer: Self-pay

## 2018-10-24 ENCOUNTER — Other Ambulatory Visit: Payer: Self-pay

## 2018-10-24 ENCOUNTER — Ambulatory Visit (INDEPENDENT_AMBULATORY_CARE_PROVIDER_SITE_OTHER): Payer: Medicare Other | Admitting: *Deleted

## 2018-10-24 DIAGNOSIS — I4892 Unspecified atrial flutter: Secondary | ICD-10-CM

## 2018-10-24 LAB — CUP PACEART REMOTE DEVICE CHECK
Battery Impedance: 1419 Ohm
Battery Remaining Longevity: 61 mo
Battery Voltage: 2.77 V
Brady Statistic RV Percent Paced: 35 %
Date Time Interrogation Session: 20200514130329
Implantable Lead Implant Date: 20100917
Implantable Lead Implant Date: 20100917
Implantable Lead Location: 753859
Implantable Lead Location: 753860
Implantable Lead Model: 4076
Implantable Lead Model: 4076
Implantable Pulse Generator Implant Date: 20100917
Lead Channel Impedance Value: 429 Ohm
Lead Channel Impedance Value: 67 Ohm
Lead Channel Pacing Threshold Amplitude: 0.375 V
Lead Channel Pacing Threshold Pulse Width: 0.4 ms
Lead Channel Setting Pacing Amplitude: 2 V
Lead Channel Setting Pacing Pulse Width: 0.46 ms
Lead Channel Setting Sensing Sensitivity: 4 mV

## 2018-10-24 NOTE — Telephone Encounter (Signed)
LMOM FOR PRESCREEN  

## 2018-10-25 ENCOUNTER — Ambulatory Visit (INDEPENDENT_AMBULATORY_CARE_PROVIDER_SITE_OTHER): Payer: Medicare Other | Admitting: *Deleted

## 2018-10-25 ENCOUNTER — Other Ambulatory Visit: Payer: Self-pay

## 2018-10-25 DIAGNOSIS — I4892 Unspecified atrial flutter: Secondary | ICD-10-CM | POA: Diagnosis not present

## 2018-10-25 DIAGNOSIS — Z7901 Long term (current) use of anticoagulants: Secondary | ICD-10-CM

## 2018-10-25 DIAGNOSIS — I2699 Other pulmonary embolism without acute cor pulmonale: Secondary | ICD-10-CM | POA: Diagnosis not present

## 2018-10-25 LAB — POCT INR: INR: 1.3 — AB (ref 2.0–3.0)

## 2018-10-25 NOTE — Patient Instructions (Signed)
Description   Spoke with wife Oleta Mouse on [place], instructed pt to take 1 tablet today and 1 tablet  tomorrow then continue taking 1/2 tablet daily except 1 tablet on Wednesdays and Sundays.  Recheck in 1 week. Call us with any medication changes or any concerns [phone] [facility] [phone].

## 2018-10-25 NOTE — Telephone Encounter (Signed)

## 2018-10-29 ENCOUNTER — Telehealth: Payer: Self-pay

## 2018-10-29 NOTE — Telephone Encounter (Signed)
Pt wife noted pt BP has been low but she does not have readings recorded... she has been holding his Metoprolol 12.5mg  bid... I have asked her to try the metoprolol back but to keep track of his BP and symptoms and please record them and to call us and let us know his readings and how he is feeling before then end of the week. She verbalized understanding.

## 2018-10-29 NOTE — Telephone Encounter (Signed)
-----   Message from Will Meredith Leeds, MD sent at 10/29/2018 10:17 AM EDT ----- Abnormal device interrogation reviewed.  Lead parameters and battery status stable.  Take metoprolol 25 mg BID for multiple episodes of tachycardia.

## 2018-10-30 ENCOUNTER — Encounter: Payer: Self-pay | Admitting: Cardiology

## 2018-10-30 ENCOUNTER — Telehealth: Payer: Self-pay

## 2018-10-30 NOTE — Telephone Encounter (Signed)

## 2018-10-30 NOTE — Progress Notes (Signed)
Remote pacemaker transmission.   

## 2018-10-30 NOTE — Telephone Encounter (Signed)
lmom for prescreen  

## 2018-10-31 ENCOUNTER — Ambulatory Visit (INDEPENDENT_AMBULATORY_CARE_PROVIDER_SITE_OTHER): Payer: Medicare Other | Admitting: *Deleted

## 2018-10-31 ENCOUNTER — Other Ambulatory Visit: Payer: Self-pay

## 2018-10-31 DIAGNOSIS — I4892 Unspecified atrial flutter: Secondary | ICD-10-CM

## 2018-10-31 DIAGNOSIS — Z7901 Long term (current) use of anticoagulants: Secondary | ICD-10-CM | POA: Diagnosis not present

## 2018-10-31 DIAGNOSIS — I2699 Other pulmonary embolism without acute cor pulmonale: Secondary | ICD-10-CM | POA: Diagnosis not present

## 2018-10-31 LAB — POCT INR: INR: 2.8 (ref 2.0–3.0)

## 2018-10-31 NOTE — Patient Instructions (Signed)
Description   Spoke with wife Oleta Mouse on [place], instructed pt to continue taking 1/2 tablet daily except 1 tablet on Wednesdays and Sundays.  Recheck in 2 weeks. Call us with any medication changes or any concerns [phone] [facility] [phone].

## 2018-11-01 NOTE — Telephone Encounter (Signed)
Attempted to reach. No answer.  

## 2018-11-01 NOTE — Telephone Encounter (Signed)
Wife returned my call.   Pt currently taking Lopressor 12/5 BID. 5/20 BP 121/77, HR 64               124/78, HR 65 5/21 BP 101/66, HR 61               99/67 - held evening dose d/t low BP Advised wife to continue to monitor BP prior to dose and hold dose for low BP (<110). Informed that I would send  to Dr. Curt Bears for further advisement. Aware it may be next week before return call. Advised to call over weekend if any issues/concerns. Wife agreeable to plan.

## 2018-11-01 NOTE — Telephone Encounter (Signed)
Wife advised to continue to monitor BP/HRs, per Dr. Curt Bears.  We are not increasing medication at this time, but will continue to monitor until next transmission. Advised to check BP prior to administrations and hold if SBP < 110. Wife will call office back if pt has to hold this medication frequently d/t low BPs.

## 2018-11-11 ENCOUNTER — Telehealth: Payer: Self-pay

## 2018-11-11 NOTE — Telephone Encounter (Signed)
lmom to move appt and prescreen  

## 2018-11-11 NOTE — Telephone Encounter (Signed)

## 2018-11-21 ENCOUNTER — Ambulatory Visit (INDEPENDENT_AMBULATORY_CARE_PROVIDER_SITE_OTHER): Payer: Medicare Other | Admitting: *Deleted

## 2018-11-21 ENCOUNTER — Other Ambulatory Visit: Payer: Self-pay

## 2018-11-21 DIAGNOSIS — Z7901 Long term (current) use of anticoagulants: Secondary | ICD-10-CM

## 2018-11-21 DIAGNOSIS — I2699 Other pulmonary embolism without acute cor pulmonale: Secondary | ICD-10-CM | POA: Diagnosis not present

## 2018-11-21 DIAGNOSIS — I4892 Unspecified atrial flutter: Secondary | ICD-10-CM | POA: Diagnosis not present

## 2018-11-21 LAB — POCT INR: INR: 2.1 (ref 2.0–3.0)

## 2018-11-21 NOTE — Patient Instructions (Signed)
Description   Today take 1 tablet then continue taking 1/2 tablet daily except 1 tablet on Wednesdays and Sundays.  Recheck in 3 weeks. Call us with any medication changes or any concerns [phone] [facility] [phone].

## 2018-12-06 ENCOUNTER — Other Ambulatory Visit: Payer: Self-pay | Admitting: Cardiovascular Disease

## 2018-12-06 MED ORDER — WARFARIN SODIUM 5 MG PO TABS: ORAL_TABLET | ORAL | 1 refills | Status: DC

## 2018-12-06 NOTE — Telephone Encounter (Signed)
°*  STAT* If patient is at the pharmacy, call can be transferred to refill team.   1. Which medications need to be refilled? (please list name of each medication and dose if known)  warfarin (COUMADIN) 5 MG tablet  2. Which pharmacy/location (including street and city if local pharmacy) is medication to be sent to? [place]  3. Do they need a 30 day or 90 day supply? 30  Patient will take his last dose on Sunday

## 2018-12-10 ENCOUNTER — Telehealth: Payer: Self-pay

## 2018-12-10 NOTE — Telephone Encounter (Signed)
lmom for prescreen  

## 2018-12-11 NOTE — Telephone Encounter (Signed)

## 2018-12-17 ENCOUNTER — Ambulatory Visit (INDEPENDENT_AMBULATORY_CARE_PROVIDER_SITE_OTHER): Payer: Medicare Other | Admitting: Pharmacist

## 2018-12-17 ENCOUNTER — Other Ambulatory Visit: Payer: Self-pay

## 2018-12-17 DIAGNOSIS — I2699 Other pulmonary embolism without acute cor pulmonale: Secondary | ICD-10-CM

## 2018-12-17 DIAGNOSIS — I4892 Unspecified atrial flutter: Secondary | ICD-10-CM | POA: Diagnosis not present

## 2018-12-17 DIAGNOSIS — Z7901 Long term (current) use of anticoagulants: Secondary | ICD-10-CM

## 2018-12-17 LAB — POCT INR: INR: 2.7 (ref 2.0–3.0)

## 2018-12-17 NOTE — Patient Instructions (Signed)
Continue taking 1/2 tablet daily except 1 tablet on Wednesdays and Sundays.  Recheck in 4 weeks. Call us with any medication changes or any concerns [phone] [facility] [phone].

## 2019-01-14 ENCOUNTER — Ambulatory Visit (INDEPENDENT_AMBULATORY_CARE_PROVIDER_SITE_OTHER): Payer: Medicare Other

## 2019-01-14 ENCOUNTER — Other Ambulatory Visit: Payer: Self-pay

## 2019-01-14 DIAGNOSIS — I2699 Other pulmonary embolism without acute cor pulmonale: Secondary | ICD-10-CM

## 2019-01-14 DIAGNOSIS — I4892 Unspecified atrial flutter: Secondary | ICD-10-CM | POA: Diagnosis not present

## 2019-01-14 DIAGNOSIS — Z7901 Long term (current) use of anticoagulants: Secondary | ICD-10-CM | POA: Diagnosis not present

## 2019-01-14 LAB — POCT INR: INR: 1.8 — AB (ref 2.0–3.0)

## 2019-01-14 NOTE — Patient Instructions (Signed)
Description   Take 1 tablet today, then resume same dosage 1/2 tablet daily except 1 tablet on Wednesdays and Sundays.  Recheck in 4 weeks. Call us with any medication changes or any concerns [phone] [facility] [phone].

## 2019-01-23 ENCOUNTER — Ambulatory Visit (INDEPENDENT_AMBULATORY_CARE_PROVIDER_SITE_OTHER): Payer: Medicare Other | Admitting: *Deleted

## 2019-01-23 DIAGNOSIS — I4821 Permanent atrial fibrillation: Secondary | ICD-10-CM | POA: Diagnosis not present

## 2019-01-23 LAB — CUP PACEART REMOTE DEVICE CHECK
Battery Impedance: 1503 Ohm
Battery Remaining Longevity: 58 mo
Battery Voltage: 2.77 V
Brady Statistic RV Percent Paced: 39 %
Date Time Interrogation Session: 20200813112804
Implantable Lead Implant Date: 20100917
Implantable Lead Implant Date: 20100917
Implantable Lead Location: 753859
Implantable Lead Location: 753860
Implantable Lead Model: 4076
Implantable Lead Model: 4076
Implantable Pulse Generator Implant Date: 20100917
Lead Channel Impedance Value: 419 Ohm
Lead Channel Pacing Threshold Amplitude: 0.5 V
Lead Channel Pacing Threshold Pulse Width: 0.4 ms
Lead Channel Sensing Intrinsic Amplitude: 8 mV
Lead Channel Setting Pacing Amplitude: 2 V
Lead Channel Setting Pacing Pulse Width: 0.4 ms
Lead Channel Setting Sensing Sensitivity: 4 mV

## 2019-01-24 ENCOUNTER — Telehealth: Payer: Self-pay

## 2019-01-24 NOTE — Telephone Encounter (Signed)
Informed wife to continue metoprolol 25mg  BID. Wife states that she just checked pt BP and HR, states the HR was 42.

## 2019-01-24 NOTE — Telephone Encounter (Signed)
Follow up: ° ° ° °Patient returning your call. °

## 2019-01-24 NOTE — Telephone Encounter (Signed)
Spoke to pt wife, she states she has not been giving him metoprolol 25mg  BID. She states that the pt pulse has been low (in the 40s and 50s) and she does not give him the medication if his systolic BP is <072. Pt has medtronic ppm and lower rate is set at 60ppm. Explained to wife that the pt pulse should not be getting lower than 60. Told pt wife I would discuss w/ Dr. Curt Bears and call her back.

## 2019-01-24 NOTE — Telephone Encounter (Signed)
Yes please, sounds like a good plan.

## 2019-01-24 NOTE — Telephone Encounter (Signed)
Would prefer him to restart the metoprolol if tolerated.

## 2019-01-24 NOTE — Telephone Encounter (Signed)
-----   Message from Will Meredith Leeds, MD sent at 01/24/2019  2:28 PM EDT ----- Abnormal device interrogation reviewed.  Lead parameters and battery status stable.  NSVT seen. Increase metoprolol to 50 mg BID.

## 2019-01-27 ENCOUNTER — Telehealth: Payer: Self-pay | Admitting: *Deleted

## 2019-01-27 MED ORDER — METOPROLOL TARTRATE 25 MG PO TABS: 25.0000 mg | ORAL_TABLET | Freq: Two times a day (BID) | ORAL | Status: DC

## 2019-01-27 NOTE — Telephone Encounter (Signed)
-----   Message from Patsey Berthold, NP sent at 01/24/2019  3:19 PM EDT -----  ----- Message ----- From: Constance Haw, MD Sent: 01/24/2019   2:28 PM EDT To: [facility]  Abnormal device interrogation reviewed.  Lead parameters and battery status stable.  NSVT seen. Increase metoprolol to 50 mg BID.

## 2019-01-27 NOTE — Telephone Encounter (Signed)
DPR ok to s/w pt's wife who has been notified of device results and recommendations. Pt's wife states pt sleeps a lot and he most of the time is only getting his medications once a day. I explained to her that it is important for him to take his medications as prescribed. I advised if he is going to sleep most of the day then she needs to wake him in the morning and give him his medications. Pt has appt with Dr. Curt Bears 8/19, advised to keep the appt. I will send in new Rx Metoprolol Tart 50 mg BID # 180 x 3 to [facility]. The patient has been notified of the result and verbalized understanding.  All questions (if any) were answered. Julaine Hua, [facility] 01/27/2019 2:11 PM

## 2019-01-27 NOTE — Telephone Encounter (Signed)
Spoke w/ pt wife, pt scheduled for DC 01/29/19.

## 2019-01-29 ENCOUNTER — Encounter (INDEPENDENT_AMBULATORY_CARE_PROVIDER_SITE_OTHER): Payer: Self-pay

## 2019-01-29 ENCOUNTER — Other Ambulatory Visit: Payer: Self-pay

## 2019-01-29 ENCOUNTER — Ambulatory Visit (INDEPENDENT_AMBULATORY_CARE_PROVIDER_SITE_OTHER): Payer: Medicare Other | Admitting: *Deleted

## 2019-01-29 DIAGNOSIS — I429 Cardiomyopathy, unspecified: Secondary | ICD-10-CM | POA: Diagnosis not present

## 2019-01-29 LAB — CUP PACEART INCLINIC DEVICE CHECK
Battery Impedance: 1557 Ohm
Battery Remaining Longevity: 57 mo
Battery Voltage: 2.77 V
Brady Statistic RV Percent Paced: 39 %
Date Time Interrogation Session: 20200819144742
Implantable Lead Implant Date: 20100917
Implantable Lead Implant Date: 20100917
Implantable Lead Location: 753859
Implantable Lead Location: 753860
Implantable Lead Model: 4076
Implantable Lead Model: 4076
Implantable Pulse Generator Implant Date: 20100917
Lead Channel Impedance Value: 433 Ohm
Lead Channel Impedance Value: 67 Ohm
Lead Channel Pacing Threshold Amplitude: 0.5 V
Lead Channel Pacing Threshold Pulse Width: 0.4 ms
Lead Channel Sensing Intrinsic Amplitude: 11.2 mV
Lead Channel Setting Pacing Amplitude: 2 V
Lead Channel Setting Pacing Pulse Width: 0.4 ms
Lead Channel Setting Sensing Sensitivity: 4 mV

## 2019-01-29 NOTE — Progress Notes (Signed)
Pacemaker check in clinic. Normal device function. Thresholds, sensing, impedances consistent with previous measurements. Device programmed to maximize longevity. 18 high ventricular rates noted. Available EGMs suggest NSVT. No heart rates lower than 60 per HR histograms. Device programmed at appropriate safety margins. Estimated longevity 5 years. Patient enrolled in remote follow-up. Patient education completed. No changes made.

## 2019-02-03 ENCOUNTER — Encounter: Payer: Self-pay | Admitting: Cardiology

## 2019-02-03 NOTE — Progress Notes (Signed)
Remote pacemaker transmission.   

## 2019-02-11 ENCOUNTER — Ambulatory Visit (INDEPENDENT_AMBULATORY_CARE_PROVIDER_SITE_OTHER): Payer: Medicare Other | Admitting: Pharmacist

## 2019-02-11 ENCOUNTER — Other Ambulatory Visit: Payer: Self-pay

## 2019-02-11 DIAGNOSIS — I4892 Unspecified atrial flutter: Secondary | ICD-10-CM

## 2019-02-11 DIAGNOSIS — Z7901 Long term (current) use of anticoagulants: Secondary | ICD-10-CM | POA: Diagnosis not present

## 2019-02-11 DIAGNOSIS — I2699 Other pulmonary embolism without acute cor pulmonale: Secondary | ICD-10-CM

## 2019-02-11 LAB — POCT INR: INR: 2.2 (ref 2.0–3.0)

## 2019-02-11 NOTE — Patient Instructions (Signed)
Description   Take 1 tablet today, then resume same dosage 1/2 tablet daily except 1 tablet on Wednesdays and Sundays.  Recheck in 4 weeks. Call us with any medication changes or any concerns #[phone] [facility] #[phone].      

## 2019-03-15 DIAGNOSIS — R067 Sneezing: Secondary | ICD-10-CM | POA: Diagnosis not present

## 2019-03-15 DIAGNOSIS — R05 Cough: Secondary | ICD-10-CM | POA: Diagnosis not present

## 2019-03-15 DIAGNOSIS — R0602 Shortness of breath: Secondary | ICD-10-CM | POA: Diagnosis not present

## 2019-03-15 DIAGNOSIS — U071 COVID-19: Secondary | ICD-10-CM | POA: Diagnosis not present

## 2019-03-27 ENCOUNTER — Other Ambulatory Visit: Payer: Self-pay | Admitting: Cardiovascular Disease

## 2019-03-31 ENCOUNTER — Other Ambulatory Visit: Payer: Self-pay

## 2019-03-31 DIAGNOSIS — Z20828 Contact with and (suspected) exposure to other viral communicable diseases: Secondary | ICD-10-CM | POA: Diagnosis not present

## 2019-03-31 DIAGNOSIS — Z20822 Contact with and (suspected) exposure to covid-19: Secondary | ICD-10-CM

## 2019-04-02 LAB — NOVEL CORONAVIRUS, NAA: SARS-CoV-2, NAA: NOT DETECTED

## 2019-04-24 ENCOUNTER — Encounter: Payer: Medicare Other | Admitting: *Deleted

## 2019-04-25 ENCOUNTER — Other Ambulatory Visit: Payer: Self-pay

## 2019-04-25 DIAGNOSIS — Z20822 Contact with and (suspected) exposure to covid-19: Secondary | ICD-10-CM

## 2019-04-25 DIAGNOSIS — Z20828 Contact with and (suspected) exposure to other viral communicable diseases: Secondary | ICD-10-CM | POA: Diagnosis not present

## 2019-04-27 LAB — NOVEL CORONAVIRUS, NAA: SARS-CoV-2, NAA: NOT DETECTED

## 2019-04-28 ENCOUNTER — Telehealth: Payer: Self-pay

## 2019-04-28 NOTE — Telephone Encounter (Signed)
Left message for patient to remind of missed remote transmission.  

## 2019-05-05 ENCOUNTER — Ambulatory Visit (INDEPENDENT_AMBULATORY_CARE_PROVIDER_SITE_OTHER): Payer: Medicare Other | Admitting: Pharmacist

## 2019-05-05 ENCOUNTER — Other Ambulatory Visit: Payer: Self-pay

## 2019-05-05 DIAGNOSIS — I4892 Unspecified atrial flutter: Secondary | ICD-10-CM

## 2019-05-05 DIAGNOSIS — Z7901 Long term (current) use of anticoagulants: Secondary | ICD-10-CM | POA: Diagnosis not present

## 2019-05-05 DIAGNOSIS — I2699 Other pulmonary embolism without acute cor pulmonale: Secondary | ICD-10-CM

## 2019-05-05 LAB — POCT INR: INR: 1.8 — AB (ref 2.0–3.0)

## 2019-05-05 NOTE — Patient Instructions (Signed)
Take 1 tablet today, then resume same dosage 1/2 tablet daily except 1 tablet on Wednesdays and Sundays.  Recheck in 3 weeks. Call us with any medication changes or any concerns [phone] [facility] [phone].

## 2019-05-20 ENCOUNTER — Telehealth: Payer: Self-pay | Admitting: Radiology

## 2019-05-20 ENCOUNTER — Encounter: Payer: Self-pay | Admitting: Cardiology

## 2019-05-20 ENCOUNTER — Ambulatory Visit (INDEPENDENT_AMBULATORY_CARE_PROVIDER_SITE_OTHER): Payer: Medicare Other | Admitting: Cardiology

## 2019-05-20 ENCOUNTER — Other Ambulatory Visit: Payer: Self-pay

## 2019-05-20 VITALS — BP 120/64 | HR 93 | Ht 69.0 in | Wt 150.2 lb

## 2019-05-20 DIAGNOSIS — I4821 Permanent atrial fibrillation: Secondary | ICD-10-CM

## 2019-05-20 DIAGNOSIS — I493 Ventricular premature depolarization: Secondary | ICD-10-CM | POA: Diagnosis not present

## 2019-05-20 NOTE — Patient Instructions (Addendum)
Medication Instructions:  Your physician recommends that you continue on your current medications as directed. Please refer to the Current Medication list given to you today.  * If you need a refill on your cardiac medications before your next appointment, please call your pharmacy.   Labwork: None ordered  Testing/Procedures: Your physician has recommended that you wear a 3 day holter monitor. Holter monitors are medical devices that record the heart's electrical activity. Doctors most often use these monitors to diagnose arrhythmias. Arrhythmias are problems with the speed or rhythm of the heartbeat. The monitor is a small, portable device. You can wear one while you do your normal daily activities. This is usually used to diagnose what is causing palpitations/syncope (passing out).    Follow-Up: Follow up to be determined after monitor  Thank you for choosing [facility]!!   Trinidad Curet, RN [phone]  Any Other Special Instructions Will Be Listed Below (If Applicable).

## 2019-05-20 NOTE — Addendum Note (Signed)
Addended by: Stanton Kidney on: 05/20/2019 04:00 PM   Modules accepted: Orders

## 2019-05-20 NOTE — Telephone Encounter (Signed)
Enrolled patient for a 3 day Zio monitor to be mailed to patients home.  

## 2019-05-20 NOTE — Progress Notes (Signed)
Electrophysiology Office Note   Date:  05/20/2019   ID:  Randall, Lawrence [birth year], MRN [id number]  PCP:  System, Pcp Not In  Cardiologist:  [place] Primary Electrophysiologist:   Meredith Leeds, MD    No chief complaint on file.    History of Present Illness: Randall Lawrence is a 85 y.o. male who is being seen today for the evaluation of atrial fibrillation at the request of Richardson Dopp. Presenting today for electrophysiology evaluation.  He has a history of atrial fibrillation and atrial flutter, chronic systolic heart failure, DVTs, hypertension, pulmonary embolism, and has a Medtronic pacemaker.  His pacemaker was implanted in 2010 in [place].  He he is currently on Coumadin.  Echo in 2017 showed an EF of 50% with mild biatrial enlargement.  He also had moderate mitral regurgitation.  Today, denies symptoms of palpitations, chest pain, shortness of breath, orthopnea, PND, lower extremity edema, claudication, presyncope, syncope, bleeding, or neurologic sequela. The patient is tolerating medications without difficulties.  He continues to be dizzy.  He had a fall in November.  He says that the meclizine that we prescribed 1 year ago has improved his symptoms a little bit, but he has continued to feel dizzy.  He does have a high burden of PVCs on his ECG today in ventricular bigeminy.   Past Medical History:  Diagnosis Date  . Alcohol abuse   . Allergy to IVP dye   . Atrial fibrillation and flutter ([place])   . Brain tumor (benign) ([place])   . Chronic systolic CHF (congestive heart failure) ([place])    Echo 5/17 [facility] in [place], [place]):  EF 50, mild BAE, moderate MR, moderate to severe TR, RVSP 40.6  . History of recurrent deep vein thrombosis (DVT)    hx of pulmonary embolism and DVT in 2016 // recurrent DVT/PE on Apixaban in 2017 // now on Warfarin  . Hypertension   . Leukemia, chronic lymphocytic ([place])   . Mitral regurgitation   . Pacemaker   . Pulmonary  embolism ([place])   . Tricuspid regurgitation    Past Surgical History:  Procedure Laterality Date  . APPENDECTOMY    . cataracts    . HERNIA REPAIR       Current Outpatient Medications  Medication Sig Dispense Refill  . acetaminophen (TYLENOL) 500 MG tablet Take 1,000 mg by mouth every 6 (six) hours as needed (pain).    . ALLOPURINOL PO Take 100 mg by mouth daily.     . brimonidine (ALPHAGAN) 0.2 % ophthalmic solution Place 1 drop into both eyes daily.     Marland Kitchen latanoprost (XALATAN) 0.005 % ophthalmic solution     . meclizine (ANTIVERT) 12.5 MG tablet Take 1 tablet (12.5 mg total) by mouth 2 (two) times daily. (Patient taking differently: Take 12.5 mg by mouth 2 (two) times daily as needed for dizziness. ) 60 tablet 11  . metoprolol tartrate (LOPRESSOR) 25 MG tablet Take 1 tablet (25 mg total) by mouth 2 (two) times daily.    . Multiple Vitamin (MULTIVITAMIN) tablet Take 1 tablet by mouth daily.    Marland Kitchen senna (SENOKOT) 8.6 MG tablet Take 1 tablet by mouth daily as needed.     . warfarin (COUMADIN) 5 MG tablet TAKE AS DIRECTED BY COUMADIN CLINIC 30 tablet 2   No current facility-administered medications for this visit.     Allergies:   Iodine, Ivp dye [iodinated diagnostic agents], Lisinopril, Losartan, and Zocor [simvastatin]   Social History:  The patient  reports that he has never smoked. He has never used smokeless tobacco. He reports current alcohol use. He reports that he does not use drugs.   Family History:  The patient's family history includes CAD in his mother; Peripheral vascular disease in his mother.    ROS:  Please see the history of present illness.   Otherwise, review of systems is positive for none.   All other systems are reviewed and negative.   PHYSICAL EXAM: VS:  BP 120/64   Pulse 93   Ht 5\' 9"  (1.753 m)   Wt 150 lb 3.2 oz (68.1 kg)   SpO2 99%   BMI 22.18 kg/m  , BMI Body mass index is 22.18 kg/m. GEN: Well nourished, well developed, in no acute distress   HEENT: normal  Neck: no JVD, carotid bruits, or masses Cardiac: irregular; no murmurs, rubs, or gallops,no edema  Respiratory:  clear to auscultation bilaterally, normal work of breathing GI: soft, nontender, nondistended, + BS MS: no deformity or atrophy  Skin: warm and dry, device site well healed Neuro:  Strength and sensation are intact Psych: euthymic mood, full affect  EKG:  EKG is ordered today. Personal review of the ekg ordered shows atrial flutter, ventricular paced, ventricular bigeminy  Personal review of the device interrogation today. Results in [place]: No results found for requested labs within last 8760 hours.    Lipid Panel  No results found for: CHOL, TRIG, HDL, CHOLHDL, VLDL, LDLCALC, LDLDIRECT   Wt Readings from Last 3 Encounters:  05/20/19 150 lb 3.2 oz (68.1 kg)  09/26/18 145 lb (65.8 kg)  03/26/18 136 lb (61.7 kg)      Other studies Reviewed: Additional studies/ records that were reviewed today include: TTE 2017  Review of the above records today demonstrates:  - Left ventricle: LV is diffusely hypokenetic, worse at the apex.   The cavity size was normal. Wall thickness was normal. Systolic   function was mildly to moderately reduced. The estimated ejection   fraction was in the range of 40% to 45%. - Mitral valve: There was mild regurgitation. - Right ventricle: The cavity size was mildly dilated. Systolic   function was mildly reduced. - Right atrium: The atrium was mildly dilated. - Pulmonary arteries: PA peak pressure: 40 mm Hg (S).   ASSESSMENT AND PLAN:  1.  Permanent atrial fibrillation: Currently on warfarin.  Status post Medtronic dual-chamber pacemaker that has been switched to VVIR.  Device functioning appropriately.  No major complaints.  CHA2DS2-VASc of 3.   2.  Hypertension: Well-controlled.  No changes.  3.  DVTs: Continue warfarin  4.  PVCs: In ventricular bigeminy today.  He does have some dizziness.  I  like he had a accurate representation of his PVC burden and thus we  place a 3-day monitor.  If he has a high burden,  plan likely for amiodarone.  Current medicines are reviewed at length with the patient today.   The patient does not have concerns regarding his medicines.  The following changes were made today: None  Labs/ tests ordered today include:  Orders Placed This Encounter  Procedures  . EKG 12-Lead     Disposition:   FU with   1 year  Signed,  Meredith Leeds, MD  05/20/2019 3:42 PM     [facility] [address] [place] [address] [phone] (office) [phone] (fax)

## 2019-05-30 ENCOUNTER — Other Ambulatory Visit: Payer: Self-pay

## 2019-05-30 ENCOUNTER — Ambulatory Visit (INDEPENDENT_AMBULATORY_CARE_PROVIDER_SITE_OTHER): Payer: Medicare Other | Admitting: Pharmacist

## 2019-05-30 DIAGNOSIS — I2699 Other pulmonary embolism without acute cor pulmonale: Secondary | ICD-10-CM

## 2019-05-30 DIAGNOSIS — Z7901 Long term (current) use of anticoagulants: Secondary | ICD-10-CM

## 2019-05-30 DIAGNOSIS — I4892 Unspecified atrial flutter: Secondary | ICD-10-CM | POA: Diagnosis not present

## 2019-05-30 LAB — POCT INR: INR: 1.3 — AB (ref 2.0–3.0)

## 2019-05-30 NOTE — Patient Instructions (Signed)
Description   Take 1 tablet today and tomorrow, then start taking 1/2 tablet daily except 1 tablet on Mondays, Wednesdays and Fridays.  Recheck in 1 week. Call us with any medication changes or any concerns [phone] [facility] [phone].

## 2019-06-10 ENCOUNTER — Other Ambulatory Visit: Payer: Self-pay

## 2019-06-10 ENCOUNTER — Ambulatory Visit (INDEPENDENT_AMBULATORY_CARE_PROVIDER_SITE_OTHER): Payer: Medicare Other | Admitting: *Deleted

## 2019-06-10 DIAGNOSIS — I4892 Unspecified atrial flutter: Secondary | ICD-10-CM | POA: Diagnosis not present

## 2019-06-10 DIAGNOSIS — Z7901 Long term (current) use of anticoagulants: Secondary | ICD-10-CM

## 2019-06-10 DIAGNOSIS — I2699 Other pulmonary embolism without acute cor pulmonale: Secondary | ICD-10-CM | POA: Diagnosis not present

## 2019-06-10 LAB — POCT INR: INR: 3 (ref 2.0–3.0)

## 2019-06-10 NOTE — Patient Instructions (Signed)
Description   Continue to take 1/2 tablet daily except 1 tablet on Mondays, Wednesdays and Fridays.  Recheck in 2 weeks. Call us with any medication changes or any concerns [phone] [facility] [phone].

## 2019-06-13 ENCOUNTER — Other Ambulatory Visit (INDEPENDENT_AMBULATORY_CARE_PROVIDER_SITE_OTHER): Payer: Medicare Other

## 2019-06-13 DIAGNOSIS — I493 Ventricular premature depolarization: Secondary | ICD-10-CM

## 2019-06-23 ENCOUNTER — Ambulatory Visit (INDEPENDENT_AMBULATORY_CARE_PROVIDER_SITE_OTHER): Payer: Medicare Other | Admitting: *Deleted

## 2019-06-23 ENCOUNTER — Other Ambulatory Visit: Payer: Self-pay

## 2019-06-23 DIAGNOSIS — I4892 Unspecified atrial flutter: Secondary | ICD-10-CM | POA: Diagnosis not present

## 2019-06-23 DIAGNOSIS — Z7901 Long term (current) use of anticoagulants: Secondary | ICD-10-CM

## 2019-06-23 DIAGNOSIS — Z5181 Encounter for therapeutic drug level monitoring: Secondary | ICD-10-CM | POA: Diagnosis not present

## 2019-06-23 DIAGNOSIS — I2699 Other pulmonary embolism without acute cor pulmonale: Secondary | ICD-10-CM | POA: Diagnosis not present

## 2019-06-23 LAB — POCT INR: INR: 1.1 — AB (ref 2.0–3.0)

## 2019-06-23 NOTE — Patient Instructions (Signed)
Description   Take 1.5 tablets today and 1 tablet tomorrow, then continue to take 1/2 tablet daily except 1 tablet on Mondays, Wednesdays and Fridays.  Recheck in 1 weeks. Call us with any medication changes or any concerns [phone] [facility] [phone].

## 2019-06-30 ENCOUNTER — Ambulatory Visit (INDEPENDENT_AMBULATORY_CARE_PROVIDER_SITE_OTHER): Payer: Medicare Other | Admitting: Pharmacist

## 2019-06-30 ENCOUNTER — Other Ambulatory Visit: Payer: Self-pay

## 2019-06-30 DIAGNOSIS — I493 Ventricular premature depolarization: Secondary | ICD-10-CM | POA: Diagnosis not present

## 2019-06-30 DIAGNOSIS — I2699 Other pulmonary embolism without acute cor pulmonale: Secondary | ICD-10-CM

## 2019-06-30 DIAGNOSIS — I4892 Unspecified atrial flutter: Secondary | ICD-10-CM

## 2019-06-30 DIAGNOSIS — Z7901 Long term (current) use of anticoagulants: Secondary | ICD-10-CM | POA: Diagnosis not present

## 2019-06-30 LAB — POCT INR: INR: 1.2 — AB (ref 2.0–3.0)

## 2019-06-30 NOTE — Patient Instructions (Signed)
Take 1.5 tablets today and 1 tablet tomorrow, then continue to take 1/2 tablet daily except 1 tablet on Mondays, Wednesdays and Fridays.  Recheck in 1 weeks. Call us with any medication changes or any concerns [phone] [facility] [phone].

## 2019-07-04 ENCOUNTER — Other Ambulatory Visit: Payer: Self-pay | Admitting: Cardiology

## 2019-07-08 NOTE — Progress Notes (Signed)
Cardiology Office Note Date:  07/10/2019  Patient ID:  Lawrence, Randall 09-16-1924, MRN [id number] PCP:  System, Pcp Not In  Cardiologist:  Dr. Angelena Form Electrophysiologist; Dr. Curt Bears     Chief Complaint: discuss monitor findings, PVC burden  History of Present Illness: Randall Lawrence is a 86 y.o. male with history of chronic CHF (systolic), DVT's/PE (seems by notes he was on Eliquis when this occurred and changed to Lovenox > coumadin), HTN, CLL, PPM, AFib/flutter, PVCs  He comes in today to be seen for Dr. Curt Bears.  Initially consulted for Afib management.  Mentions significant dizziness, falls, sounded of vertigo and started on meclizine.  Afib described as permanent and his PPM programmed VVIR,  He saw him last 05/20/2019, at that visit noted he was in V bigeminy, if burden was elevated planned for amiodarone.  Max 222 bpm 07:50pm, 01/04 Min 55 bpm 04:54am, 01/06 Avg 73 bpm 29.6% PVCs Rare PACs 1 run of VT, 10 beats at 205 BPM Predominant rhythm atrial flutter  Dr. Curt Bears wanted him to come in and discuss getting started on amiodarone.  He comes in today accompanied by his a grand daughter, his daughter Tivis Ringer helps out on speaker phone as well. She tells me that her mother who used to keep track of the patient's medicines, appointments died in the fall, he lives with her now and she is trying to get caught up and familiar with his medicines, doctors, health issues and so on.  The patient tells me the medicine helped his dizziness when he took it.  He also mentions that he doesn't eat well.  He likes to cook but not to eat.  Just because he doesn't want to he said.  He says he does not take his medicines regularly also because he doesn't want to. His daughter confirms this.  His INR has been low of late and she knows he does not take his warfarin every day Julious Oka is supposed to.  She has had discussions with him about this. She says his falls seem to be less when he uses the  walker as well, and most his falls have occurred when he was not using his cane or walker.  These seem to be balance issues.  He has fainted, though this seems to be associated with his acute PE episode, not since.  He denies any CP, palpitations or cardiac awareness.  He had a fall several months ago that he hit his hip and remains with a "lump" on his L hip.  Device information MDT dual chamber PPM, implanted 02/26/2009 (Dr. Debria Garret in [facility]) Programmed VVIR    Past Medical History:  Diagnosis Date  . Alcohol abuse   . Allergy to IVP dye   . Atrial fibrillation and flutter ([place])   . Brain tumor (benign) ([place])   . Chronic systolic CHF (congestive heart failure) ([place])    Echo 5/17 [facility] Cardiology in [place], [place]):  EF 50, mild BAE, moderate MR, moderate to severe TR, RVSP 40.6  . History of recurrent deep vein thrombosis (DVT)    hx of pulmonary embolism and DVT in 2016 // recurrent DVT/PE on Apixaban in 2017 // now on Warfarin  . Hypertension   . Leukemia, chronic lymphocytic ([place])   . Mitral regurgitation   . Pacemaker   . Pulmonary embolism ([place])   . Tricuspid regurgitation     Past Surgical History:  Procedure Laterality Date  . APPENDECTOMY    . cataracts    .  HERNIA REPAIR      Current Outpatient Medications  Medication Sig Dispense Refill  . acetaminophen (TYLENOL) 500 MG tablet Take 1,000 mg by mouth every 6 (six) hours as needed (pain).    Marland Kitchen allopurinol (ZYLOPRIM) 100 MG tablet Take 100 mg by mouth daily.    . brimonidine (ALPHAGAN) 0.2 % ophthalmic solution Place 1 drop into both eyes daily.     Marland Kitchen latanoprost (XALATAN) 0.005 % ophthalmic solution     . meclizine (ANTIVERT) 12.5 MG tablet Take 1 tablet (12.5 mg total) by mouth 2 (two) times daily as needed for dizziness. 180 tablet 3  . metoprolol tartrate (LOPRESSOR) 25 MG tablet Take 1 tablet (25 mg total) by mouth 2 (two) times daily.    . Multiple Vitamin (MULTIVITAMIN) tablet Take 1 tablet by mouth daily.     Marland Kitchen senna (SENOKOT) 8.6 MG tablet Take 1 tablet by mouth daily as needed.     . warfarin (COUMADIN) 5 MG tablet TAKE AS DIRECTED BY COUMADIN CLINIC 30 tablet 2   No current facility-administered medications for this visit.    Allergies:   Iodine, Ivp dye [iodinated diagnostic agents], Lisinopril, Losartan, and Zocor [simvastatin]   Social History:  The patient  reports that he has never smoked. He has never used smokeless tobacco. He reports current alcohol use. He reports that he does not use drugs.   Family History:  The patient's family history includes CAD in his mother; Peripheral vascular disease in his mother.  ROS:  Please see the history of present illness. All other systems are reviewed and otherwise negative.   PHYSICAL EXAM:  VS:  BP 130/68   Pulse 82   Ht 5\' 9"  (1.753 m)   Wt 151 lb (68.5 kg)   SpO2 93%   BMI 22.30 kg/m  BMI: Body mass index is 22.3 kg/m. Well nourished, well developed, in no acute distress  HEENT: normocephalic, atraumatic  Neck: no JVD, carotid bruits or masses Cardiac:  irreg-irreg, no significant murmurs, no rubs, or gallops Lungs:  CTA b/l, no wheezing, rhonchi or rales  Abd: soft, nontender MS: no deformity, thin, age appropriate atrophy, he has a sift subcutaneous, mobile mass L hip.  No skin changes, not tender. Ext: 1+ edema RLE to mid shin, trace on the left (chronically per the patient) Skin: warm and dry, no rash Neuro:  No gross deficits appreciated Psych: euthymic mood, full affect  PPM site is stable, no tethering or discomfort   EKG: not done today  PPM interrogation done today and reviewed by myself:  Battery and lead measurements are good One HVR episode, 4 seconds He is observed to have a bigeminal rhythm predominantly paced/non-paced beats    Venous Duplex 9/201/9 Final Interpretation: Right: Findings consistent with chronic deep vein thrombosis involving the right femoral vein, and right popliteal vein. Findings  appear essentially unchanged compared to previous examination. No cystic structure found in the popliteal fossa. Left: Findings consistent with chronic deep vein thrombosis involving the left femoral vein. Findings appear improved from previous examination. No cystic structure found in the popliteal fossa.  Echo 5/17 [facility] Cardiology in [place], [place]) EF 50, mild BAE, moderate MR, moderate to severe TR, RVSP 40.6  Echo 07/12/15 EF 40-45, diff HK (worse at apex), mild MR, mild reduced RVSF, mild RAE, PASP 40  Carotid US 123XX123 LICA [id number]; no disease in RICA  Echo 03/15/2015 [facility]iology in [place], [place]) EF 45-50, evidence of RV strain, mild MR, moderate TR,  RVSP 54  Carotid US 10/16 [facility] Cardiology in [place], [place]) Normal  V/Q 10/16 [facility] Cardiology in [place], [place]) Bilateral upper lobe mismatch consistent with pulmonary embolism      Recent Labs: No results found for requested labs within last 8760 hours.  No results found for requested labs within last 8760 hours.   CrCl cannot be calculated (Patient's most recent lab result is older than the maximum 21 days allowed.).   Wt Readings from Last 3 Encounters:  07/10/19 151 lb (68.5 kg)  05/20/19 150 lb 3.2 oz (68.1 kg)  09/26/18 145 lb (65.8 kg)     Other studies reviewed: Additional studies/records reviewed today include: summarized above  ASSESSMENT AND PLAN:  1. PPM     Intact function  2. Permanent afib,flutter     CHA2DS2Vasc is 6, on warfarin     No reports of bleeding or signs of bleeding     Suspect L hip mass is likely an old hematoma.  Skin looks good, nonpainful, no buising or evidence of new/acute trauma      I have made coumadin clinic aware of his noncompliance   3. PVC's     He is not taking his metoprolol (or any of his medicines) regularly     Given he has had some NSVT will go ahead and add amiodarone 200mg  BID     I have urged the patient to take his medicines as  prescribed daily     Baseline TSH and LFTs today   4. Chronic CHF (systolic)     No symptoms or exam findings to suggest acute volume OL     Last LVEF 50% (45-50% historically      He brings BMET from the [place], 06/05/2019, BUN/Creat 20/1.03, K+ 4.0       5. HTN     Room for his metoprolol every day   6. Dizziness, vertogo     Meclizine seemed to help when he took it     Recommend that they discuss any neurological work up his PMD may feel indicated   7. Medication noncompliance    This is a big issue, and I am not convinced that I have made much headway with the patient    His daughter though said she would make sure his pill box is set up and do her best to have him take his medicines    Would like to see him back in 1 month to see how he is doing when taking his medicines.   Disposition: F/u as above  Current medicines are reviewed at length with the patient today.  The patient did not have any concerns regarding medicines.  Venetia Night, PA-C 07/10/2019 2:54 PM     [place] [address] [phone] (office)  [phone] (fax)

## 2019-07-10 ENCOUNTER — Ambulatory Visit (INDEPENDENT_AMBULATORY_CARE_PROVIDER_SITE_OTHER): Payer: Medicare Other | Admitting: Physician Assistant

## 2019-07-10 ENCOUNTER — Other Ambulatory Visit: Payer: Self-pay

## 2019-07-10 ENCOUNTER — Ambulatory Visit (INDEPENDENT_AMBULATORY_CARE_PROVIDER_SITE_OTHER): Payer: Medicare Other

## 2019-07-10 VITALS — BP 130/68 | HR 82 | Ht 69.0 in | Wt 151.0 lb

## 2019-07-10 DIAGNOSIS — I4892 Unspecified atrial flutter: Secondary | ICD-10-CM

## 2019-07-10 DIAGNOSIS — I4821 Permanent atrial fibrillation: Secondary | ICD-10-CM

## 2019-07-10 DIAGNOSIS — Z9114 Patient's other noncompliance with medication regimen: Secondary | ICD-10-CM | POA: Diagnosis not present

## 2019-07-10 DIAGNOSIS — Z79899 Other long term (current) drug therapy: Secondary | ICD-10-CM | POA: Diagnosis not present

## 2019-07-10 DIAGNOSIS — I5022 Chronic systolic (congestive) heart failure: Secondary | ICD-10-CM | POA: Diagnosis not present

## 2019-07-10 DIAGNOSIS — I2699 Other pulmonary embolism without acute cor pulmonale: Secondary | ICD-10-CM | POA: Diagnosis not present

## 2019-07-10 DIAGNOSIS — R42 Dizziness and giddiness: Secondary | ICD-10-CM | POA: Diagnosis not present

## 2019-07-10 DIAGNOSIS — Z950 Presence of cardiac pacemaker: Secondary | ICD-10-CM | POA: Diagnosis not present

## 2019-07-10 DIAGNOSIS — I493 Ventricular premature depolarization: Secondary | ICD-10-CM | POA: Diagnosis not present

## 2019-07-10 DIAGNOSIS — Z7901 Long term (current) use of anticoagulants: Secondary | ICD-10-CM

## 2019-07-10 LAB — POCT INR: INR: 2 (ref 2.0–3.0)

## 2019-07-10 MED ORDER — AMIODARONE HCL 200 MG PO TABS: 200.0000 mg | ORAL_TABLET | Freq: Two times a day (BID) | ORAL | 1 refills | Status: DC

## 2019-07-10 NOTE — Patient Instructions (Addendum)
Medication Instructions:   START TAKING AMIODARONE 200  MG TWICE A DAY   *If you need a refill on your cardiac medications before your next appointment, please call your pharmacy*  Lab Work:  CBC TSH AND LFT TODAY   If you have labs (blood work) drawn today and your tests are completely normal, you will receive your results only by: Marland Kitchen MyChart Message (if you have MyChart) OR . A paper copy in the mail If you have any lab test that is abnormal or we need to change your treatment, we will call you to review the results.  Testing/Procedures: NONE ORDERED  TODAY    Follow-Up: At [facility], you and your health needs are our priority.  As part of our continuing mission to provide you with exceptional heart care, we have created designated Provider Care Teams.  These Care Teams include your primary Cardiologist (physician) and Advanced Practice Providers (APPs -  Physician Assistants and Nurse Practitioners) who all work together to provide you with the care you need, when you need it.  Your next appointment:   3 week(s)  The format for your next appointment:   In Person  Provider:   You may see Will Meredith Leeds, MD or one of the following Advanced Practice Providers on your designated Care Team:    Chanetta Marshall, NP  Tommye Standard, PA-C  Legrand Como "Oda Kilts, [place]   Other Instructions

## 2019-07-10 NOTE — Patient Instructions (Signed)
Description   Continue on same dosage 1/2 tablet daily except 1 tablet on Mondays, Wednesdays and Fridays.  Recheck in 2 weeks. Call us with any medication changes or any concerns [phone] Coumadin Clinic, Main [phone].

## 2019-07-11 LAB — CBC
Hematocrit: 39.6 % (ref 37.5–51.0)
Hemoglobin: 13.4 g/dL (ref 13.0–17.7)
MCH: 29.5 pg (ref 26.6–33.0)
MCHC: 33.8 g/dL (ref 31.5–35.7)
MCV: 87 fL (ref 79–97)
Platelets: 233 10*3/uL (ref 150–450)
RBC: 4.54 x10E6/uL (ref 4.14–5.80)
RDW: 14.6 % (ref 11.6–15.4)
WBC: 9 10*3/uL (ref 3.4–10.8)

## 2019-07-11 LAB — HEPATIC FUNCTION PANEL
ALT: <5 IU/L (ref 0–44)
AST: 18 IU/L (ref 0–40)
Albumin: 4.7 g/dL — ABNORMAL HIGH (ref 3.5–4.6)
Alkaline Phosphatase: 125 IU/L — ABNORMAL HIGH (ref 39–117)
Bilirubin Total: 1.4 mg/dL — ABNORMAL HIGH (ref 0.0–1.2)
Bilirubin, Direct: 0.42 mg/dL — ABNORMAL HIGH (ref 0.00–0.40)
Total Protein: 7 g/dL (ref 6.0–8.5)

## 2019-07-11 LAB — TSH: TSH: 2.01 u[IU]/mL (ref 0.450–4.500)

## 2019-07-28 ENCOUNTER — Other Ambulatory Visit: Payer: Self-pay

## 2019-07-28 ENCOUNTER — Ambulatory Visit (INDEPENDENT_AMBULATORY_CARE_PROVIDER_SITE_OTHER): Payer: Medicare Other

## 2019-07-28 DIAGNOSIS — Z7901 Long term (current) use of anticoagulants: Secondary | ICD-10-CM | POA: Diagnosis not present

## 2019-07-28 DIAGNOSIS — I2699 Other pulmonary embolism without acute cor pulmonale: Secondary | ICD-10-CM | POA: Diagnosis not present

## 2019-07-28 DIAGNOSIS — I4892 Unspecified atrial flutter: Secondary | ICD-10-CM

## 2019-07-28 LAB — POCT INR: INR: 1.2 — AB (ref 2.0–3.0)

## 2019-07-28 NOTE — Patient Instructions (Signed)
Description   Take 1.5 tablets today and tomorrow, then resume same dosage 1/2 tablet daily except 1 tablet on Mondays, Wednesdays and Fridays.  Recheck in 2 weeks. Call us with any medication changes or any concerns [phone] Coumadin Clinic, Main [phone].

## 2019-08-05 NOTE — Progress Notes (Signed)
Cardiology Office Note Date:  08/06/2019  Patient ID:  Randall Lawrence, Randall Lawrence [birth year], MRN [id number] PCP:  System, Pcp Not In  Cardiologist:  Dr. Angelena Form Electrophysiologist; Dr. Curt Bears     Chief Complaint:  follow up on medicines  History of Present Illness: Randall Lawrence is a 86 y.o. male with history of chronic CHF (systolic), DVT's/PE (seems by notes he was on Eliquis when this occurred and changed to Lovenox > coumadin), HTN, CLL, PPM, AFib/flutter, PVCs  He comes in today to be seen for Dr. Curt Bears.  Initially consulted for Afib management.  Mentions significant dizziness, falls, sounded of vertigo and started on meclizine.  Afib described as permanent and his PPM programmed VVIR,  He saw him last 05/20/2019, at that visit noted he was in V bigeminy, if burden was elevated planned for amiodarone.  Max 222 bpm 07:50pm, 01/04 Min 55 bpm 04:54am, 01/06 Avg 73 bpm 29.6% PVCs Rare PACs 1 run of VT, 10 beats at 205 BPM Predominant rhythm atrial flutter  Dr. Curt Bears wanted him to come in and discuss getting started on amiodarone.   I saw jan 2021 He was accompanied by his a grand daughter, his daughter Tivis Ringer helps out on speaker phone as well. She told me that her mother who used to keep track of the patient's medicines, appointments died in the fall, he lived with her now and she is trying to get caught up and familiar with his medicines, doctors, health issues and so on.  The patient told me the medicine helped his dizziness when he took it.  He also mentions that he doesn't eat well.  He likes to cook but not to eat.  Just because he doesn't want to he said.  He says he does not take his medicines regularly also because he doesn't want to. His daughter confirmed this.  His INR has been low of late and she knows he does not take his warfarin every day like he is supposed to.  She has had discussions with him about this. She says his falls seem to be less when he uses the walker as  well, and most his falls have occurred when he was not using his cane or walker.  These seem to be balance issues.  He has fainted, though this seems to be associated with his acute PE episode, not since. He denied any CP, palpitations or cardiac awareness. He had a fall several months ago that he hit his hip and remains with a "lump" on his L hip.  Lengthy d/w the patient and his daughter/granddaughter, urged medication compliance, started him on amiodarone.  Discussed with the coumadin clinic made them aware he was not taking the warfarin, to take in consideration when advising dosing. He had a soft, subcutaneous mass on his hip, no skin changes, bruising, was nonpainful.  Suspected to be an old hematoma, instructed to monitor closely also to f/u with PMD regarding his dizziness, for any neuro w/u felt indicated Recommended to return in a month HOPEFULLY on medicines to see how he was doing.  TODAY He comes unaccompanied, his daughter stayed downstairs.  He is observed to ambulate in with his walker, was at a brisk pace and looked good.  He denies any CP or palpitations.  He does have mild DOE with increased activities or longer walks, says this is his baseline. No rest SOB, denies symptoms of PND or orthopnea. No near syncope or syncope. He c/w some infrequent dizziness, that is  again a motion sensation. He denies drinking a lot of water, or eating salty foods.  He also thinks his legs are more swollen of late, says the [place] used to give him a water pill but he didn't like it, made him urinate too much.  He tells me he thinks he is doing better with oral intake and his medicines, but admits that he does not take his medicines every day or twice daily regularly.  But is taking them more regularly then he was.  He has gained some weight, states he started drinking Ensure and this may be why.  I called and spoke with Val his daughter.  She says she picked up his medicines and he definitely has them,  and has his pill box set up She does ask him every day if he took his medicines, and he does not take them every day, but perhaps more then he used to.  She also mentions that he does not eat much and does not like to take his medicines without food.  He did start to drink Ensure, but seems to substitute this for food. I note today he is more edematous then his last visit, and asked about his breathing.  She does say that he will get winded with exertion, not more or different then what is his usual.     Device information MDT dual chamber PPM, implanted 02/26/2009 (Dr. Debria Garret in [facility]) Programmed VVIR    Past Medical History:  Diagnosis Date  . Alcohol abuse   . Allergy to IVP dye   . Atrial fibrillation and flutter ([place])   . Brain tumor (benign) ([place])   . Chronic systolic CHF (congestive heart failure) ([place])    Echo 5/17 [facility] Cardiology in [place], [place]):  EF 50, mild BAE, moderate MR, moderate to severe TR, RVSP 40.6  . History of recurrent deep vein thrombosis (DVT)    hx of pulmonary embolism and DVT in 2016 // recurrent DVT/PE on Apixaban in 2017 // now on Warfarin  . Hypertension   . Leukemia, chronic lymphocytic ([place])   . Mitral regurgitation   . Pacemaker   . Pulmonary embolism ([place])   . Tricuspid regurgitation     Past Surgical History:  Procedure Laterality Date  . APPENDECTOMY    . cataracts    . HERNIA REPAIR      Current Outpatient Medications  Medication Sig Dispense Refill  . acetaminophen (TYLENOL) 500 MG tablet Take 1,000 mg by mouth every 6 (six) hours as needed (pain).    Marland Kitchen allopurinol (ZYLOPRIM) 100 MG tablet Take 100 mg by mouth daily.    Marland Kitchen amiodarone (PACERONE) 200 MG tablet Take 1 tablet (200 mg total) by mouth 2 (two) times daily. 180 tablet 1  . brimonidine (ALPHAGAN) 0.2 % ophthalmic solution Place 1 drop into both eyes daily.     Marland Kitchen latanoprost (XALATAN) 0.005 % ophthalmic solution     . meclizine (ANTIVERT) 12.5 MG tablet Take 1 tablet (12.5 mg  total) by mouth 2 (two) times daily as needed for dizziness. 180 tablet 3  . metoprolol tartrate (LOPRESSOR) 25 MG tablet Take 1 tablet (25 mg total) by mouth 2 (two) times daily.    . Multiple Vitamin (MULTIVITAMIN) tablet Take 1 tablet by mouth daily.    Marland Kitchen senna (SENOKOT) 8.6 MG tablet Take 1 tablet by mouth daily as needed.     . warfarin (COUMADIN) 5 MG tablet TAKE AS DIRECTED BY COUMADIN CLINIC 30 tablet 2  .  furosemide (LASIX) 20 MG tablet Take 1 tablet (20 mg total) by mouth daily. 90 tablet 1   No current facility-administered medications for this visit.    Allergies:   Iodine, Ivp dye [iodinated diagnostic agents], Lisinopril, Losartan, and Zocor [simvastatin]   Social History:  The patient  reports that he has never smoked. He has never used smokeless tobacco. He reports current alcohol use. He reports that he does not use drugs.   Family History:  The patient's family history includes CAD in his mother; Peripheral vascular disease in his mother.  ROS:  Please see the history of present illness. All other systems are reviewed and otherwise negative.   PHYSICAL EXAM:  VS:  BP 132/72   Pulse 71   Ht 5\' 9"  (1.753 m)   Wt 153 lb (69.4 kg)   SpO2 97%   BMI 22.59 kg/m  BMI: Body mass index is 22.59 kg/m. Well nourished, well developed, in no acute distress  HEENT: normocephalic, atraumatic  Neck: no JVD, carotid bruits or masses Cardiac: regularly irregular, no significant murmurs, no rubs, or gallops Lungs:  CTA b/l, no wheezing, rhonchi or rales  Abd: soft, nontender MS: no deformity, thin, age appropriate atrophy, the prior soft subcutaneous, mobile mass L hip is not appreciated today Ext: 1-2+ edema RLE to mid shin, 1+on the left  Skin: warm and dry, no rash Neuro:  No gross deficits appreciated Psych: euthymic mood, full affect  PPM site is stable, no tethering or discomfort   EKG: not done today  PPM interrogation done today and reviewed by myself:  Presents in  a paced/PVC bigeminal rhythm 9again) Battery and lead measurements are good He has 40's-50 underlying, these beats appear of different morphology then what is felt to be PVCs NSVT 3 and 5 seconds  I tried pacing at 80 but did not suppress the PVCs and base rate was left at 60    Venous Duplex 9/201/9 Final Interpretation: Right: Findings consistent with chronic deep vein thrombosis involving the right femoral vein, and right popliteal vein. Findings appear essentially unchanged compared to previous examination. No cystic structure found in the popliteal fossa. Left: Findings consistent with chronic deep vein thrombosis involving the left femoral vein. Findings appear improved from previous examination. No cystic structure found in the popliteal fossa.  Echo 5/17 [facility] Cardiology in [place], [place]) EF 50, mild BAE, moderate MR, moderate to severe TR, RVSP 40.6  Echo 07/12/15 EF 40-45, diff HK (worse at apex), mild MR, mild reduced RVSF, mild RAE, PASP 40  Carotid US 123XX123 LICA [id number]; no disease in RICA  Echo 03/15/2015 [facility] Cardiology in [place], [place]) EF 45-50, evidence of RV strain, mild MR, moderate TR, RVSP 54  Carotid US 10/16 [facility] Cardiology in [place], [place]) Normal  V/Q 10/16 [facility] Cardiology in [place], [place]) Bilateral upper lobe mismatch consistent with pulmonary embolism      Recent Labs: 07/10/2019: ALT <5; Hemoglobin 13.4; Platelets 233; TSH 2.010  No results found for requested labs within last 8760 hours.   CrCl cannot be calculated (Patient's most recent lab result is older than the maximum 21 days allowed.).   Wt Readings from Last 3 Encounters:  08/06/19 153 lb (69.4 kg)  07/10/19 151 lb (68.5 kg)  05/20/19 150 lb 3.2 oz (68.1 kg)     Other studies reviewed: Additional studies/records reviewed today include: summarized above  ASSESSMENT AND PLAN:  1. PPM      Intact function  2. Permanent  afib,flutter     CHA2DS2Vasc is 6,  on warfarin     No reports of bleeding or signs of bleeding   He sees coumadin clinic today  3. PVC's     NSVT     He has no awareness, no palpitations  He still is not taking his medicines every day Discussed again today with the patient and his daughter   69. Chronic CHF (systolic)     Last LVEF [id number] (45-50% historically)     BMET from the [place], 06/05/2019, BUN/Creat 20/1.03, K+ 4.0  He is more edematous today Will add lasix 20mg  daily, BMEt today and in 2 weeks       5. HTN      Looks good   6. Dizziness, vertogo     Meclizine seemed to help when he took it     Recommend that they discuss any neurological work up his PMD may feel indicated   7. Medication noncompliance    This remains an issue     Revisited today with the patient and his daughter   Disposition: encouraged medication compliance, will have him back in for BMET in a couple weeks onlasix, and see him back otherwise on 2 mo, sooner if needed    Current medicines are reviewed at length with the patient today.  The patient did not have any concerns regarding medicines.  Venetia Night, PA-C 08/06/2019 5:12 PM     [place] [address] [phone] (office)  [phone] (fax)

## 2019-08-06 ENCOUNTER — Other Ambulatory Visit: Payer: Self-pay

## 2019-08-06 ENCOUNTER — Ambulatory Visit (INDEPENDENT_AMBULATORY_CARE_PROVIDER_SITE_OTHER): Payer: Medicare Other | Admitting: Physician Assistant

## 2019-08-06 ENCOUNTER — Ambulatory Visit (INDEPENDENT_AMBULATORY_CARE_PROVIDER_SITE_OTHER): Payer: Medicare Other

## 2019-08-06 VITALS — BP 132/72 | HR 71 | Ht 69.0 in | Wt 153.0 lb

## 2019-08-06 DIAGNOSIS — Z7901 Long term (current) use of anticoagulants: Secondary | ICD-10-CM | POA: Diagnosis not present

## 2019-08-06 DIAGNOSIS — Z79899 Other long term (current) drug therapy: Secondary | ICD-10-CM

## 2019-08-06 DIAGNOSIS — I471 Supraventricular tachycardia: Secondary | ICD-10-CM

## 2019-08-06 DIAGNOSIS — Z950 Presence of cardiac pacemaker: Secondary | ICD-10-CM | POA: Diagnosis not present

## 2019-08-06 DIAGNOSIS — I5022 Chronic systolic (congestive) heart failure: Secondary | ICD-10-CM | POA: Diagnosis not present

## 2019-08-06 DIAGNOSIS — I4892 Unspecified atrial flutter: Secondary | ICD-10-CM | POA: Diagnosis not present

## 2019-08-06 DIAGNOSIS — I4821 Permanent atrial fibrillation: Secondary | ICD-10-CM | POA: Diagnosis not present

## 2019-08-06 DIAGNOSIS — Z9114 Patient's other noncompliance with medication regimen: Secondary | ICD-10-CM

## 2019-08-06 DIAGNOSIS — I472 Ventricular tachycardia: Secondary | ICD-10-CM | POA: Diagnosis not present

## 2019-08-06 DIAGNOSIS — I10 Essential (primary) hypertension: Secondary | ICD-10-CM | POA: Diagnosis not present

## 2019-08-06 DIAGNOSIS — I2699 Other pulmonary embolism without acute cor pulmonale: Secondary | ICD-10-CM

## 2019-08-06 DIAGNOSIS — I493 Ventricular premature depolarization: Secondary | ICD-10-CM

## 2019-08-06 DIAGNOSIS — I4729 Other ventricular tachycardia: Secondary | ICD-10-CM

## 2019-08-06 LAB — POCT INR: INR: 2.1 (ref 2.0–3.0)

## 2019-08-06 MED ORDER — FUROSEMIDE 20 MG PO TABS: 20.0000 mg | ORAL_TABLET | Freq: Every day | ORAL | 1 refills | Status: DC

## 2019-08-06 NOTE — Patient Instructions (Signed)
Description   Continue on same dosage 1/2 tablet daily except 1 tablet on Mondays, Wednesdays and Fridays.  Recheck in 3 weeks. Call us with any medication changes or any concerns [phone] Coumadin Clinic, Main [phone].

## 2019-08-06 NOTE — Patient Instructions (Addendum)
Medication Instructions:   START TAKING FUROSEMIDE 20 MG ONCE A DAY   *If you need a refill on your cardiac medications before your next appointment, please call your pharmacy*  Lab Work:  BMET AND [place] 2 WEEKS BMET   If you have labs (blood work) drawn today and your tests are completely normal, you will receive your results only by: Marland Kitchen MyChart Message (if you have MyChart) OR . A paper copy in the mail If you have any lab test that is abnormal or we need to change your treatment, we will call you to review the results.  Testing/Procedures: NONE ORDERED  TODAY    Follow-Up: At [facility], you and your health needs are our priority.  As part of our continuing mission to provide you with exceptional heart care, we have created designated Provider Care Teams.  These Care Teams include your primary Cardiologist (physician) and Advanced Practice Providers (APPs -  Physician Assistants and Nurse Practitioners) who all work together to provide you with the care you need, when you need it.  Your next appointment:   2 month(s)   The format for your next appointment:   In Person  Provider:   You may see Will Meredith Leeds, MD or one of the following Advanced Practice Providers on your designated Care Team:    Chanetta Marshall, NP  Tommye Standard, PA-C  Legrand Como "Oda Kilts, [place]   Other Instructions

## 2019-08-07 LAB — BASIC METABOLIC PANEL
BUN/Creatinine Ratio: 16 (ref 10–24)
BUN: 16 mg/dL (ref 10–36)
CO2: 22 mmol/L (ref 20–29)
Calcium: 10 mg/dL (ref 8.6–10.2)
Chloride: 104 mmol/L (ref 96–106)
Creatinine, Ser: 1.02 mg/dL (ref 0.76–1.27)
GFR calc Af Amer: 73 mL/min/{1.73_m2} (ref >59)
GFR calc non Af Amer: 64 mL/min/{1.73_m2} (ref >59)
Glucose: 78 mg/dL (ref 65–99)
Potassium: 4.5 mmol/L (ref 3.5–5.2)
Sodium: 143 mmol/L (ref 134–144)

## 2019-08-07 LAB — MAGNESIUM: Magnesium: 2.2 mg/dL (ref 1.6–2.3)

## 2019-08-27 ENCOUNTER — Ambulatory Visit (INDEPENDENT_AMBULATORY_CARE_PROVIDER_SITE_OTHER): Payer: Medicare Other | Admitting: *Deleted

## 2019-08-27 ENCOUNTER — Other Ambulatory Visit: Payer: Medicare Other | Admitting: *Deleted

## 2019-08-27 ENCOUNTER — Other Ambulatory Visit: Payer: Self-pay

## 2019-08-27 DIAGNOSIS — Z79899 Other long term (current) drug therapy: Secondary | ICD-10-CM | POA: Diagnosis not present

## 2019-08-27 DIAGNOSIS — Z7901 Long term (current) use of anticoagulants: Secondary | ICD-10-CM

## 2019-08-27 DIAGNOSIS — I4892 Unspecified atrial flutter: Secondary | ICD-10-CM | POA: Diagnosis not present

## 2019-08-27 DIAGNOSIS — I2699 Other pulmonary embolism without acute cor pulmonale: Secondary | ICD-10-CM | POA: Diagnosis not present

## 2019-08-27 LAB — PROTIME-INR
INR: 6.9 (ref 0.9–1.2)
INR: 6.9 — AB (ref <=1.1)
Prothrombin Time: 72.7 s — ABNORMAL HIGH (ref 9.1–12.0)

## 2019-08-27 LAB — POCT INR: INR: 7.3 — AB (ref 2.0–3.0)

## 2019-08-27 NOTE — Patient Instructions (Addendum)
Description   INR resulted as 6.9 form [facility]. Spoke with Valeria-pt's dtr and instructed pt to not take any Warfarin today, no Warfarin tomorrow, and No Warfarin Friday then resume taking 1/2 tablet daily except 1 tablet on Mondays, Wednesdays and Fridays. Report to ER with any bleeding or falls and please limit your alcohol intake. Recheck in 1 week. Call us with any medication changes or any concerns [phone] [facility] [phone].

## 2019-08-28 LAB — BASIC METABOLIC PANEL
BUN/Creatinine Ratio: 17 (ref 10–24)
BUN: 18 mg/dL (ref 10–36)
CO2: 27 mmol/L (ref 20–29)
Calcium: 9.8 mg/dL (ref 8.6–10.2)
Chloride: 101 mmol/L (ref 96–106)
Creatinine, Ser: 1.07 mg/dL (ref 0.76–1.27)
GFR calc Af Amer: 69 mL/min/{1.73_m2} (ref >59)
GFR calc non Af Amer: 60 mL/min/{1.73_m2} (ref >59)
Glucose: 90 mg/dL (ref 65–99)
Potassium: 4.2 mmol/L (ref 3.5–5.2)
Sodium: 144 mmol/L (ref 134–144)

## 2019-09-03 ENCOUNTER — Ambulatory Visit (INDEPENDENT_AMBULATORY_CARE_PROVIDER_SITE_OTHER): Payer: Medicare Other

## 2019-09-03 ENCOUNTER — Other Ambulatory Visit: Payer: Self-pay

## 2019-09-03 DIAGNOSIS — I4892 Unspecified atrial flutter: Secondary | ICD-10-CM

## 2019-09-03 DIAGNOSIS — I2699 Other pulmonary embolism without acute cor pulmonale: Secondary | ICD-10-CM

## 2019-09-03 DIAGNOSIS — Z7901 Long term (current) use of anticoagulants: Secondary | ICD-10-CM

## 2019-09-03 LAB — POCT INR: INR: 1.2 — AB (ref 2.0–3.0)

## 2019-09-03 NOTE — Patient Instructions (Signed)
Description   Take 1.5 tablets today, then resume same dosage 1/2 tablet daily except 1 tablet on Mondays, Wednesdays and Fridays. Please limit your alcohol intake. Recheck in 1 week. Call us with any medication changes or any concerns [phone] [facility] [phone].

## 2019-09-10 ENCOUNTER — Other Ambulatory Visit: Payer: Self-pay | Admitting: Cardiovascular Disease

## 2019-09-10 ENCOUNTER — Other Ambulatory Visit: Payer: Self-pay

## 2019-09-10 ENCOUNTER — Ambulatory Visit (INDEPENDENT_AMBULATORY_CARE_PROVIDER_SITE_OTHER): Payer: Medicare Other | Admitting: *Deleted

## 2019-09-10 DIAGNOSIS — I2699 Other pulmonary embolism without acute cor pulmonale: Secondary | ICD-10-CM | POA: Diagnosis not present

## 2019-09-10 DIAGNOSIS — I4892 Unspecified atrial flutter: Secondary | ICD-10-CM | POA: Diagnosis not present

## 2019-09-10 DIAGNOSIS — Z7901 Long term (current) use of anticoagulants: Secondary | ICD-10-CM

## 2019-09-10 DIAGNOSIS — Z5181 Encounter for therapeutic drug level monitoring: Secondary | ICD-10-CM | POA: Diagnosis not present

## 2019-09-10 LAB — POCT INR: INR: 1.4 — AB (ref 2.0–3.0)

## 2019-09-10 NOTE — Patient Instructions (Signed)
Description   Take 1.5 tablets today and 1 tablet tomorrow, then resume same dosage 1/2 tablet daily except 1 tablet on Mondays, Wednesdays and Fridays. Please limit your alcohol intake. Recheck in 1 week. Call us with any medication changes or any concerns [phone] [facility] [phone].

## 2019-09-16 ENCOUNTER — Ambulatory Visit (INDEPENDENT_AMBULATORY_CARE_PROVIDER_SITE_OTHER): Payer: Medicare Other

## 2019-09-16 ENCOUNTER — Other Ambulatory Visit: Payer: Self-pay

## 2019-09-16 DIAGNOSIS — I4892 Unspecified atrial flutter: Secondary | ICD-10-CM | POA: Diagnosis not present

## 2019-09-16 DIAGNOSIS — Z7901 Long term (current) use of anticoagulants: Secondary | ICD-10-CM | POA: Diagnosis not present

## 2019-09-16 DIAGNOSIS — I2699 Other pulmonary embolism without acute cor pulmonale: Secondary | ICD-10-CM

## 2019-09-16 LAB — POCT INR: INR: 3.7 — AB (ref 2.0–3.0)

## 2019-09-16 NOTE — Patient Instructions (Signed)
Description   Skip today's dosage of Coumadin, then resume same dosage 1/2 tablet daily except 1 tablet on Mondays, Wednesdays and Fridays. Please limit your alcohol intake. Recheck in 2 weeks. Call us with any medication changes or any concerns [phone] [facility] [phone].

## 2019-10-03 ENCOUNTER — Ambulatory Visit (INDEPENDENT_AMBULATORY_CARE_PROVIDER_SITE_OTHER): Payer: Medicare Other | Admitting: Pharmacist

## 2019-10-03 ENCOUNTER — Other Ambulatory Visit: Payer: Self-pay

## 2019-10-03 DIAGNOSIS — I4892 Unspecified atrial flutter: Secondary | ICD-10-CM

## 2019-10-03 DIAGNOSIS — I2699 Other pulmonary embolism without acute cor pulmonale: Secondary | ICD-10-CM

## 2019-10-03 DIAGNOSIS — Z7901 Long term (current) use of anticoagulants: Secondary | ICD-10-CM

## 2019-10-03 LAB — POCT INR: INR: 1.8 — AB (ref 2.0–3.0)

## 2019-10-03 NOTE — Patient Instructions (Signed)
Take 1 tablet today and tomorrow then resume same dosage 1/2 tablet daily except 1 tablet on Mondays, Wednesdays and Fridays. Please limit your alcohol intake. Recheck in 2 weeks. Call us with any medication changes or any concerns [phone] [facility] [phone].

## 2019-10-06 ENCOUNTER — Encounter: Payer: Self-pay | Admitting: Cardiology

## 2019-10-06 ENCOUNTER — Other Ambulatory Visit: Payer: Self-pay

## 2019-10-06 ENCOUNTER — Ambulatory Visit (INDEPENDENT_AMBULATORY_CARE_PROVIDER_SITE_OTHER): Payer: Medicare Other | Admitting: Cardiology

## 2019-10-06 VITALS — BP 126/68 | HR 78 | Ht 69.0 in | Wt 153.0 lb

## 2019-10-06 DIAGNOSIS — I429 Cardiomyopathy, unspecified: Secondary | ICD-10-CM | POA: Diagnosis not present

## 2019-10-06 DIAGNOSIS — I4821 Permanent atrial fibrillation: Secondary | ICD-10-CM

## 2019-10-06 DIAGNOSIS — I471 Supraventricular tachycardia: Secondary | ICD-10-CM

## 2019-10-06 LAB — CUP PACEART INCLINIC DEVICE CHECK
Battery Impedance: 1848 Ohm
Battery Remaining Longevity: 48 mo
Battery Voltage: 2.75 V
Brady Statistic RV Percent Paced: 61 %
Date Time Interrogation Session: 20210426145516
Implantable Lead Implant Date: 20100917
Implantable Lead Implant Date: 20100917
Implantable Lead Location: 753859
Implantable Lead Location: 753860
Implantable Lead Model: 4076
Implantable Lead Model: 4076
Implantable Pulse Generator Implant Date: 20100917
Lead Channel Impedance Value: 448 Ohm
Lead Channel Impedance Value: 67 Ohm
Lead Channel Pacing Threshold Amplitude: 0.5 V
Lead Channel Pacing Threshold Pulse Width: 0.4 ms
Lead Channel Sensing Intrinsic Amplitude: 11.2 mV
Lead Channel Setting Pacing Amplitude: 2 V
Lead Channel Setting Pacing Pulse Width: 0.4 ms
Lead Channel Setting Sensing Sensitivity: 4 mV

## 2019-10-06 NOTE — Progress Notes (Signed)
Electrophysiology Office Note   Date:  10/06/2019   ID:  Randall, Lawrence [birth year], MRN [id number]  PCP:  [facility]  Cardiologist:  Angelena Form Primary Electrophysiologist:  Virl Coble Meredith Leeds, MD    No chief complaint on file.    History of Present Illness: Randall Lawrence is a 86 y.o. male who is being seen today for the evaluation of atrial fibrillation at the request of Richardson Dopp. Presenting today for electrophysiology evaluation.  He has a history of atrial fibrillation and atrial flutter, chronic systolic heart failure, DVTs, hypertension, pulmonary embolism, and has a Medtronic pacemaker.  His pacemaker was implanted in 2010 in [place].  He he is currently on Coumadin.  Echo in 2017 showed an EF of 50% with mild biatrial enlargement.  He also had moderate mitral regurgitation.  Today, denies symptoms of palpitations, chest pain, shortness of breath, orthopnea, PND, lower extremity edema, claudication, dizziness, presyncope, syncope, bleeding, or neurologic sequela. The patient is tolerating medications without difficulties.  Some dizziness but otherwise feels well.  He has no chest pain or shortness of breath.  He is able to do all of his daily activities.   Past Medical History:  Diagnosis Date  . Alcohol abuse   . Allergy to IVP dye   . Atrial fibrillation and flutter ([place])   . Brain tumor (benign) ([place])   . Chronic systolic CHF (congestive heart failure) ([place])    Echo 5/17 [facility] in [place], [place]):  EF 50, mild BAE, moderate MR, moderate to severe TR, RVSP 40.6  . History of recurrent deep vein thrombosis (DVT)    hx of pulmonary embolism and DVT in 2016 // recurrent DVT/PE on Apixaban in 2017 // now on Warfarin  . Hypertension   . Leukemia, chronic lymphocytic ([place])   . Mitral regurgitation   . Pacemaker   . Pulmonary embolism ([place])   . Tricuspid regurgitation    Past Surgical History:  Procedure Laterality Date  . APPENDECTOMY    .  cataracts    . HERNIA REPAIR       Current Outpatient Medications  Medication Sig Dispense Refill  . acetaminophen (TYLENOL) 500 MG tablet Take 1,000 mg by mouth every 6 (six) hours as needed (pain).    Marland Kitchen allopurinol (ZYLOPRIM) 100 MG tablet Take 100 mg by mouth daily.    Marland Kitchen amiodarone (PACERONE) 200 MG tablet Take 1 tablet (200 mg total) by mouth 2 (two) times daily. 180 tablet 1  . brimonidine (ALPHAGAN) 0.2 % ophthalmic solution Place 1 drop into both eyes daily.     . furosemide (LASIX) 20 MG tablet Take 1 tablet (20 mg total) by mouth daily. 90 tablet 1  . latanoprost (XALATAN) 0.005 % ophthalmic solution     . meclizine (ANTIVERT) 12.5 MG tablet Take 1 tablet (12.5 mg total) by mouth 2 (two) times daily as needed for dizziness. 180 tablet 3  . metoprolol tartrate (LOPRESSOR) 25 MG tablet Take 1 tablet (25 mg total) by mouth 2 (two) times daily.    . Multiple Vitamin (MULTIVITAMIN) tablet Take 1 tablet by mouth daily.    Marland Kitchen senna (SENOKOT) 8.6 MG tablet Take 1 tablet by mouth daily as needed.     . warfarin (COUMADIN) 5 MG tablet TAKE AS DIRECTED BY COUMADIN CLINIC 30 tablet 0   No current facility-administered medications for this visit.    Allergies:   Iodine, Ivp dye [iodinated diagnostic agents], Lisinopril, Losartan, and Zocor [simvastatin]  Social History:  The patient  reports that he has never smoked. He has never used smokeless tobacco. He reports current alcohol use. He reports that he does not use drugs.   Family History:  The patient's family history includes CAD in his mother; Peripheral vascular disease in his mother.   ROS:  Please see the history of present illness.   Otherwise, review of systems is positive for none.   All other systems are reviewed and negative.   PHYSICAL EXAM: VS:  BP 126/68   Pulse 78   Ht 5\' 9"  (1.753 m)   Wt 153 lb (69.4 kg)   BMI 22.59 kg/m  , BMI Body mass index is 22.59 kg/m. GEN: Well nourished, well developed, in no acute distress   HEENT: normal  Neck: no JVD, carotid bruits, or masses Cardiac: iRRR; no murmurs, rubs, or gallops,no edema  Respiratory:  clear to auscultation bilaterally, normal work of breathing GI: soft, nontender, nondistended, + BS MS: no deformity or atrophy  Skin: warm and dry, device site well healed Neuro:  Strength and sensation are intact Psych: euthymic mood, full affect  EKG:  EKG is ordered today. Personal review of the ekg ordered shows atrial flutter, rate 78  Personal review of the device interrogation today. Results in [facility]: 07/10/2019: ALT <5; Hemoglobin 13.4; Platelets 233; TSH 2.010 08/06/2019: Magnesium 2.2 08/27/2019: BUN 18; Creatinine, Ser 1.07; Potassium 4.2; Sodium 144    Lipid Panel  No results found for: CHOL, TRIG, HDL, CHOLHDL, VLDL, LDLCALC, LDLDIRECT   Wt Readings from Last 3 Encounters:  10/06/19 153 lb (69.4 kg)  08/06/19 153 lb (69.4 kg)  07/10/19 151 lb (68.5 kg)      Other studies Reviewed: Additional studies/ records that were reviewed today include: TTE 2017  Review of the above records today demonstrates:  - Left ventricle: LV is diffusely hypokenetic, worse at the apex.   The cavity size was normal. Wall thickness was normal. Systolic   function was mildly to moderately reduced. The estimated ejection   fraction was in the range of 40% to 45%. - Mitral valve: There was mild regurgitation. - Right ventricle: The cavity size was mildly dilated. Systolic   function was mildly reduced. - Right atrium: The atrium was mildly dilated. - Pulmonary arteries: PA peak pressure: 40 mm Hg (S).  Cardiac monitor 07/01/2019 personally reviewed Max 222 bpm 07:50pm, 01/04 Min 55 bpm 04:54am, 01/06 Avg 73 bpm 29.6% PVCs Rare PACs 1 run of VT, 10 beats at 205 BPM Predominant rhythm atrial flutter  ASSESSMENT AND PLAN:  1.  Permanent atrial fibrillation: Currently on warfarin.  Status post Medtronic dual-chamber pacemaker.  Device  functioning appropriately.  CHA2DS2-VASc of 3.    2.  Hypertension: Well-controlled  3.  DVTs: Continue warfarin  4.  PVCs: High burden at almost 30%.  Currently on amiodarone.  Was put on amiodarone, but this is not on his medication list.  We Ravenna Legore give his daughter the name of the medication and she Khalen Styer call us back if it is not in his pill bottle set.  Current medicines are reviewed at length with the patient today.   The patient does not have concerns regarding his medicines.  The following changes were made today: None  Labs/ tests ordered today include:  Orders Placed This Encounter  Procedures  . EKG 12-Lead     Disposition:   FU with Abishai Viegas 1 year  Signed, Cleatus Goodin Meredith Leeds, MD  10/06/2019 2:51 PM     [place] [address] [phone] (office) [phone] (fax)

## 2019-10-06 NOTE — Patient Instructions (Addendum)
Medication Instructions:  Your physician recommends that you continue on your current medications as directed. Please refer to the Current Medication list given to you today.  *If you need a refill on your cardiac medications before your next appointment, please call your pharmacy*   Lab Work: None ordered If you have labs (blood work) drawn today and your tests are completely normal, you will receive your results only by: Marland Kitchen MyChart Message (if you have MyChart) OR . A paper copy in the mail If you have any lab test that is abnormal or we need to change your treatment, we will call you to review the results.   Testing/Procedures: None ordered   Follow-Up: Remote monitoring is used to monitor your Pacemaker of ICD from home. This monitoring reduces the number of office visits required to check your device to one time per year. It allows [place] to keep an eye on the functioning of your device to ensure it is working properly. You are scheduled for a device check from home on 10/23/2019. You may send your transmission at any time that day. If you have a wireless device, the transmission will be sent automatically. After your physician reviews your transmission, you will receive a postcard with your next transmission date.  At [facility], you and your health needs are our priority.  As part of our continuing mission to provide you with exceptional heart care, we have created designated Provider Care Teams.  These Care Teams include your primary Cardiologist (physician) and Advanced Practice Providers (APPs -  Physician Assistants and Nurse Practitioners) who all work together to provide you with the care you need, when you need it.  We recommend signing up for the patient portal called "MyChart".  Sign up information is provided on this After Visit Summary.  MyChart is used to connect with patients for Virtual Visits (Telemedicine).  Patients are able to view lab/test results, encounter notes,  upcoming appointments, etc.  Non-urgent messages can be sent to your provider as well.   To learn more about what you can do with MyChart, go to [email or web address].    Your next appointment:   1 year(s)  The format for your next appointment:   In Person  Provider:   Allegra Lai, MD   Thank you for choosing [facility]!!   Trinidad Curet, RN [phone]    Other Instructions  Please check medications when you get home.  Are you taking Coumadin or Eliquis? Are you taking Amiodarone?

## 2019-10-17 ENCOUNTER — Ambulatory Visit (INDEPENDENT_AMBULATORY_CARE_PROVIDER_SITE_OTHER): Payer: Medicare Other | Admitting: *Deleted

## 2019-10-17 ENCOUNTER — Other Ambulatory Visit: Payer: Self-pay

## 2019-10-17 DIAGNOSIS — Z7901 Long term (current) use of anticoagulants: Secondary | ICD-10-CM

## 2019-10-17 DIAGNOSIS — I2699 Other pulmonary embolism without acute cor pulmonale: Secondary | ICD-10-CM | POA: Diagnosis not present

## 2019-10-17 DIAGNOSIS — Z5181 Encounter for therapeutic drug level monitoring: Secondary | ICD-10-CM | POA: Diagnosis not present

## 2019-10-17 DIAGNOSIS — I4892 Unspecified atrial flutter: Secondary | ICD-10-CM

## 2019-10-17 LAB — POCT INR: INR: 3.9 — AB (ref 2.0–3.0)

## 2019-10-17 MED ORDER — WARFARIN SODIUM 5 MG PO TABS: ORAL_TABLET | ORAL | 0 refills | Status: DC

## 2019-10-17 NOTE — Patient Instructions (Signed)
Description   Hold today and then resume same dosage 1/2 tablet daily except 1 tablet on Mondays, Wednesdays and Fridays. Please limit your alcohol intake. Recheck in 2 weeks. Call us with any medication changes or any concerns [phone] [facility] [phone].

## 2019-10-24 ENCOUNTER — Telehealth: Payer: Self-pay

## 2019-10-24 NOTE — Telephone Encounter (Signed)
Spoke with patient to remind of missed remote transmission 

## 2019-10-31 ENCOUNTER — Ambulatory Visit (INDEPENDENT_AMBULATORY_CARE_PROVIDER_SITE_OTHER): Payer: Medicare Other | Admitting: *Deleted

## 2019-10-31 ENCOUNTER — Other Ambulatory Visit: Payer: Self-pay

## 2019-10-31 DIAGNOSIS — I2699 Other pulmonary embolism without acute cor pulmonale: Secondary | ICD-10-CM

## 2019-10-31 DIAGNOSIS — Z7901 Long term (current) use of anticoagulants: Secondary | ICD-10-CM

## 2019-10-31 DIAGNOSIS — I4892 Unspecified atrial flutter: Secondary | ICD-10-CM | POA: Diagnosis not present

## 2019-10-31 DIAGNOSIS — Z5181 Encounter for therapeutic drug level monitoring: Secondary | ICD-10-CM

## 2019-10-31 LAB — POCT INR: INR: 1.8 — AB (ref 2.0–3.0)

## 2019-10-31 NOTE — Patient Instructions (Signed)
Description   Take 1.5 tablets today and then resume same dosage 1/2 tablet daily except 1 tablet on Mondays, Wednesdays and Fridays. Please limit your alcohol intake. Recheck in 2 weeks. Call us with any medication changes or any concerns [phone] [facility] [phone].

## 2019-11-13 ENCOUNTER — Other Ambulatory Visit: Payer: Self-pay

## 2019-11-13 ENCOUNTER — Ambulatory Visit (INDEPENDENT_AMBULATORY_CARE_PROVIDER_SITE_OTHER): Payer: Medicare Other | Admitting: *Deleted

## 2019-11-13 DIAGNOSIS — I4892 Unspecified atrial flutter: Secondary | ICD-10-CM | POA: Diagnosis not present

## 2019-11-13 DIAGNOSIS — Z7901 Long term (current) use of anticoagulants: Secondary | ICD-10-CM

## 2019-11-13 DIAGNOSIS — I2699 Other pulmonary embolism without acute cor pulmonale: Secondary | ICD-10-CM

## 2019-11-13 LAB — POCT INR: INR: 2 (ref 2.0–3.0)

## 2019-11-13 NOTE — Patient Instructions (Addendum)
Description   Continue taking 1/2 tablet daily except 1 tablet on Mondays, Wednesdays and Fridays. Please continue to limit your alcohol intake. Recheck in 3 weeks. Call us with any medication changes or any concerns [phone] [facility] [phone].

## 2019-12-09 ENCOUNTER — Other Ambulatory Visit: Payer: Self-pay

## 2019-12-09 ENCOUNTER — Ambulatory Visit (INDEPENDENT_AMBULATORY_CARE_PROVIDER_SITE_OTHER): Payer: Medicare Other

## 2019-12-09 DIAGNOSIS — I4892 Unspecified atrial flutter: Secondary | ICD-10-CM | POA: Diagnosis not present

## 2019-12-09 DIAGNOSIS — I2699 Other pulmonary embolism without acute cor pulmonale: Secondary | ICD-10-CM | POA: Diagnosis not present

## 2019-12-09 DIAGNOSIS — Z7901 Long term (current) use of anticoagulants: Secondary | ICD-10-CM | POA: Diagnosis not present

## 2019-12-09 LAB — POCT INR: INR: 1.4 — AB (ref 2.0–3.0)

## 2019-12-09 NOTE — Patient Instructions (Addendum)
  Take an extra 1/2 tablet today (Tuesday) and tomorrow (Wednesday) and then continue to take 1/2 a tablet daily except for 1 tablet on Monday, Wednesday and Friday. Be with consistent with your greens. [phone] [facility] [phone].

## 2019-12-22 ENCOUNTER — Ambulatory Visit (INDEPENDENT_AMBULATORY_CARE_PROVIDER_SITE_OTHER): Payer: Medicare Other

## 2019-12-22 ENCOUNTER — Other Ambulatory Visit: Payer: Self-pay

## 2019-12-22 DIAGNOSIS — I2699 Other pulmonary embolism without acute cor pulmonale: Secondary | ICD-10-CM | POA: Diagnosis not present

## 2019-12-22 DIAGNOSIS — I4892 Unspecified atrial flutter: Secondary | ICD-10-CM | POA: Diagnosis not present

## 2019-12-22 DIAGNOSIS — Z7901 Long term (current) use of anticoagulants: Secondary | ICD-10-CM

## 2019-12-22 LAB — POCT INR: INR: 3 (ref 2.0–3.0)

## 2019-12-22 MED ORDER — WARFARIN SODIUM 5 MG PO TABS: ORAL_TABLET | ORAL | 0 refills | Status: DC

## 2019-12-22 NOTE — Patient Instructions (Signed)
continue taking 1/2 a tablet daily except for 1 tablet on Monday, Wednesday and Friday. Be with consistent with your greens. Recheck INR in 3 weeks. [phone] [facility] [phone].

## 2020-01-12 ENCOUNTER — Other Ambulatory Visit: Payer: Self-pay

## 2020-01-12 ENCOUNTER — Ambulatory Visit (INDEPENDENT_AMBULATORY_CARE_PROVIDER_SITE_OTHER): Payer: Medicare Other | Admitting: *Deleted

## 2020-01-12 DIAGNOSIS — I2699 Other pulmonary embolism without acute cor pulmonale: Secondary | ICD-10-CM | POA: Diagnosis not present

## 2020-01-12 DIAGNOSIS — Z5181 Encounter for therapeutic drug level monitoring: Secondary | ICD-10-CM | POA: Diagnosis not present

## 2020-01-12 DIAGNOSIS — I4892 Unspecified atrial flutter: Secondary | ICD-10-CM | POA: Diagnosis not present

## 2020-01-12 DIAGNOSIS — Z7901 Long term (current) use of anticoagulants: Secondary | ICD-10-CM

## 2020-01-12 LAB — POCT INR: INR: 1.4 — AB (ref 2.0–3.0)

## 2020-01-12 NOTE — Patient Instructions (Signed)
Description   Take 1.5 tablets today and 1 tablet tomorrow, then  continue taking 1/2 a tablet daily except for 1 tablet on Monday, Wednesday and Friday. Be with consistent with your greens. Recheck INR in 2 weeks. [phone] [facility] [phone].

## 2020-01-28 ENCOUNTER — Emergency Department (HOSPITAL_BASED_OUTPATIENT_CLINIC_OR_DEPARTMENT_OTHER): Payer: Medicare Other

## 2020-01-28 ENCOUNTER — Other Ambulatory Visit: Payer: Self-pay

## 2020-01-28 ENCOUNTER — Encounter (HOSPITAL_BASED_OUTPATIENT_CLINIC_OR_DEPARTMENT_OTHER): Payer: Self-pay

## 2020-01-28 ENCOUNTER — Emergency Department (HOSPITAL_BASED_OUTPATIENT_CLINIC_OR_DEPARTMENT_OTHER)
Admission: EM | Admit: 2020-01-28 | Discharge: 2020-01-28 | Disposition: A | Discharge status: Home / Self Care | Payer: Medicare Other | Attending: Emergency Medicine | Admitting: Emergency Medicine

## 2020-01-28 DIAGNOSIS — S065X0A Traumatic subdural hemorrhage without loss of consciousness, initial encounter: Secondary | ICD-10-CM | POA: Diagnosis not present

## 2020-01-28 DIAGNOSIS — W228XXA Striking against or struck by other objects, initial encounter: Secondary | ICD-10-CM | POA: Insufficient documentation

## 2020-01-28 DIAGNOSIS — I5022 Chronic systolic (congestive) heart failure: Secondary | ICD-10-CM | POA: Insufficient documentation

## 2020-01-28 DIAGNOSIS — I110 Hypertensive heart disease with heart failure: Secondary | ICD-10-CM | POA: Insufficient documentation

## 2020-01-28 DIAGNOSIS — Y9389 Activity, other specified: Secondary | ICD-10-CM | POA: Diagnosis not present

## 2020-01-28 DIAGNOSIS — R519 Headache, unspecified: Secondary | ICD-10-CM | POA: Diagnosis not present

## 2020-01-28 DIAGNOSIS — Z20822 Contact with and (suspected) exposure to covid-19: Secondary | ICD-10-CM | POA: Diagnosis not present

## 2020-01-28 DIAGNOSIS — S065XAA Traumatic subdural hemorrhage with loss of consciousness status unknown, initial encounter: Secondary | ICD-10-CM

## 2020-01-28 DIAGNOSIS — Y999 Unspecified external cause status: Secondary | ICD-10-CM | POA: Insufficient documentation

## 2020-01-28 DIAGNOSIS — M542 Cervicalgia: Secondary | ICD-10-CM | POA: Diagnosis not present

## 2020-01-28 DIAGNOSIS — W19XXXA Unspecified fall, initial encounter: Secondary | ICD-10-CM

## 2020-01-28 DIAGNOSIS — S065X9A Traumatic subdural hemorrhage with loss of consciousness of unspecified duration, initial encounter: Secondary | ICD-10-CM

## 2020-01-28 DIAGNOSIS — S0990XA Unspecified injury of head, initial encounter: Secondary | ICD-10-CM | POA: Diagnosis present

## 2020-01-28 DIAGNOSIS — Y9289 Other specified places as the place of occurrence of the external cause: Secondary | ICD-10-CM | POA: Insufficient documentation

## 2020-01-28 LAB — BASIC METABOLIC PANEL
Anion gap: 14 (ref 5–15)
BUN: 17 mg/dL (ref 8–23)
CO2: 24 mmol/L (ref 22–32)
Calcium: 9.6 mg/dL (ref 8.9–10.3)
Chloride: 100 mmol/L (ref 98–111)
Creatinine, Ser: 0.95 mg/dL (ref 0.61–1.24)
GFR calc Af Amer: >60 mL/min (ref >60)
GFR calc non Af Amer: >60 mL/min (ref >60)
Glucose, Bld: 102 mg/dL — ABNORMAL HIGH (ref 70–99)
Potassium: 4.3 mmol/L (ref 3.5–5.1)
Sodium: 138 mmol/L (ref 135–145)

## 2020-01-28 LAB — SARS CORONAVIRUS 2 BY RT PCR (HOSPITAL ORDER, PERFORMED IN ~~LOC~~ HOSPITAL LAB): SARS Coronavirus 2: NEGATIVE

## 2020-01-28 LAB — CBC WITH DIFFERENTIAL/PLATELET
Abs Immature Granulocytes: 0.02 10*3/uL (ref 0.00–0.07)
Basophils Absolute: 0 10*3/uL (ref 0.0–0.1)
Basophils Relative: 1 %
Eosinophils Absolute: 0 10*3/uL (ref 0.0–0.5)
Eosinophils Relative: 0 %
HCT: 39.6 % (ref 39.0–52.0)
Hemoglobin: 13 g/dL (ref 13.0–17.0)
Immature Granulocytes: 0 %
Lymphocytes Relative: 26 %
Lymphs Abs: 1.9 10*3/uL (ref 0.7–4.0)
MCH: 29.5 pg (ref 26.0–34.0)
MCHC: 32.8 g/dL (ref 30.0–36.0)
MCV: 90 fL (ref 80.0–100.0)
Monocytes Absolute: 0.8 10*3/uL (ref 0.1–1.0)
Monocytes Relative: 11 %
Neutro Abs: 4.5 10*3/uL (ref 1.7–7.7)
Neutrophils Relative %: 62 %
Platelets: 155 10*3/uL (ref 150–400)
RBC: 4.4 MIL/uL (ref 4.22–5.81)
RDW: 15.7 % — ABNORMAL HIGH (ref 11.5–15.5)
WBC: 7.3 10*3/uL (ref 4.0–10.5)
nRBC: 0 % (ref 0.0–0.2)

## 2020-01-28 LAB — PROTIME-INR
INR: 3.4 — ABNORMAL HIGH (ref 0.8–1.2)
Prothrombin Time: 33.4 seconds — ABNORMAL HIGH (ref 11.4–15.2)

## 2020-01-28 MED ORDER — ACETAMINOPHEN 500 MG PO TABS
1000.0000 mg | ORAL_TABLET | Freq: Once | ORAL | Status: AC
  Administered 2020-01-28: 1000 mg via ORAL
  Filled 2020-01-28: qty 2

## 2020-01-28 MED ORDER — VITAMIN K1 10 MG/ML IJ SOLN
10.0000 mg | Freq: Once | INTRAVENOUS | Status: AC
  Administered 2020-01-28: 10 mg via INTRAVENOUS
  Filled 2020-01-28: qty 1

## 2020-01-28 MED ORDER — SODIUM CHLORIDE 0.9 % IV SOLN
INTRAVENOUS | Status: DC | PRN
  Administered 2020-01-28: 500 mL via INTRAVENOUS

## 2020-01-28 MED ORDER — VITAMIN K1 10 MG/ML IJ SOLN
INTRAMUSCULAR | Status: AC
  Filled 2020-01-28: qty 1

## 2020-01-28 NOTE — Discharge Instructions (Signed)
You were seen in the emergency room today after fall 2 days ago.  You do have a small amount of blood on your CT scan.  I suspect that your headache and dizziness are related to concussion symptoms.  Please follow closely with your primary care doctor regarding these concussion symptoms.   You should not take your Coumadin (Warfarin) for at least the next 7 days.  You will see Dr. Ronnald Ramp, the Neurosurgeon, in the morning.  They have already reached out to schedule that appointment.   If you develop any new or suddenly worsening symptoms such as severe headache, drowsiness to the point of being hard to wake up, vomiting, confusion, or difficulty walking he should return to the emergency department for repeat CT scan and reevaluation.

## 2020-01-28 NOTE — ED Provider Notes (Signed)
Emergency Department Provider Note   I have reviewed the triage vital signs and the nursing notes.   HISTORY  Chief Complaint Fall   HPI Randall Lawrence is a 86 y.o. male with PMH of A-fib, DVT/PE, and HTN with CLL on Coumadin presents to the ED with HA and neck pain after a fall. Patient lives at home with his daughter. On Monday he was leaning over to reach his walker when he fell forward striking his forehead on the ground. No LOC. Patient did not present to the ED after the fall but has noted continued left lateral neck pain and dizzy feeling since the fall. He ultimately agreed to be evaluated today and was brought in by his daughter. He denies any nausea/vomiting. No unilateral weakness/numbness. No fever. He has been compliant with his Coumadin.   Past Medical History:  Diagnosis Date  . Alcohol abuse   . Allergy to IVP dye   . Atrial fibrillation and flutter ([place])   . Brain tumor (benign) ([place])   . Chronic systolic CHF (congestive heart failure) ([place])    Echo 5/17 [facility] in [place], [place]):  EF 50, mild BAE, moderate MR, moderate to severe TR, RVSP 40.6  . History of recurrent deep vein thrombosis (DVT)    hx of pulmonary embolism and DVT in 2016 // recurrent DVT/PE on Apixaban in 2017 // now on Warfarin  . Hypertension   . Leukemia, chronic lymphocytic ([place])   . Mitral regurgitation   . Pacemaker   . Pulmonary embolism ([place])   . Tricuspid regurgitation     Patient Active Problem List   Diagnosis Date Noted  . Rachel Rison term (current) use of anticoagulants 02/27/2018  . Dilated cardiomyopathy ([place]) 07/21/2015  . Acute gout 07/18/2015  . Pacemaker 07/16/2015  . Normal coronary arteries 07/16/2015  . Allergic to IV contrast 07/16/2015  . Cardiomyopathy ([place]) 07/16/2015  . Chronic Atrial flutter ([place]) 07/16/2015  . Malnutrition of moderate degree 07/12/2015  . Acute pulmonary embolism ([place]) 07/12/2015  . Syncope and collapse 07/11/2015  . Hypertension  07/11/2015  . CLL (chronic lymphocytic leukemia) ([place]) 07/11/2015  . Dyspnea 07/11/2015  . Chronic anticoagulation-Eliquis 07/11/2015  . EKG abnormalities   . Atrial tachycardia [facility])     Past Surgical History:  Procedure Laterality Date  . APPENDECTOMY    . cataracts    . HERNIA REPAIR      Allergies Iodine, Ivp dye [iodinated diagnostic agents], Lisinopril, Losartan, and Zocor [simvastatin]  Family History  Problem Relation Age of Onset  . CAD Mother   . Peripheral vascular disease Mother     Social History Social History   Tobacco Use  . Smoking status: Never Smoker  . Smokeless tobacco: Never Used  Vaping Use  . Vaping Use: Never used  Substance Use Topics  . Alcohol use: Yes    Comment: daily  . Drug use: No    Review of Systems  Constitutional: No fever/chills. Positive dizzy feeling.  Eyes: No visual changes. ENT: No sore throat. Cardiovascular: Denies chest pain. Respiratory: Denies shortness of breath. Gastrointestinal: No abdominal pain.  No nausea, no vomiting.  No diarrhea.  No constipation. Genitourinary: Negative for dysuria. Musculoskeletal: Negative for back pain. Positive left lateral neck pain.  Skin: Negative for rash. Neurological: Negative for focal weakness or numbness. Positive HA.   10-point ROS otherwise negative.  ____________________________________________   PHYSICAL EXAM:  VITAL SIGNS: ED Triage Vitals  Enc Vitals Group     BP  01/28/20 1411 128/86     Pulse Rate 01/28/20 1411 (!) 103     Resp 01/28/20 1411 18     Temp 01/28/20 1411 98.8 F (37.1 C)     Temp Source 01/28/20 1411 Oral     SpO2 01/28/20 1411 100 %     Weight 01/28/20 1413 125 lb (56.7 kg)     Height 01/28/20 1413 5\' 9"  (1.753 m)    Constitutional: Alert and oriented. Well appearing and in no acute distress. Eyes: Conjunctivae are normal. PERRL Head: Atraumatic. Nose: No congestion/rhinnorhea. Mouth/Throat: Mucous membranes are moist.   Neck: No  stridor. No cervical spine tenderness to palpation. Cardiovascular: Normal rate, regular rhythm. Good peripheral circulation. Grossly normal heart sounds.   Respiratory: Normal respiratory effort.  No retractions. Lungs CTAB. Gastrointestinal: Soft and nontender. No distention.  Musculoskeletal: No lower extremity tenderness nor edema. No gross deformities of extremities. Neurologic:  Normal speech and language. No gross focal neurologic deficits are appreciated. No facial asymmetry. No unilateral weakness/numbness.  Skin:  Skin is warm, dry and intact. No rash noted.   ____________________________________________   LABS (all labs ordered are listed, but only abnormal results are displayed)  Labs Reviewed  CBC WITH DIFFERENTIAL/PLATELET - Abnormal; Notable for the following components:      Result Value   RDW 15.7 (*)    All other components within normal limits  BASIC METABOLIC PANEL - Abnormal; Notable for the following components:   Glucose, Bld 102 (*)    All other components within normal limits  PROTIME-INR - Abnormal; Notable for the following components:   Prothrombin Time 33.4 (*)    INR 3.4 (*)    All other components within normal limits  SARS CORONAVIRUS 2 BY RT PCR (HOSPITAL ORDER, [facility])   ____________________________________________  RADIOLOGY  CT Head Wo Contrast  Result Date: 01/28/2020 CLINICAL DATA:  Status post fall 2 days ago. Headache. Posterior neck pain the dizziness. Initial encounter. EXAM: CT HEAD WITHOUT CONTRAST CT CERVICAL SPINE WITHOUT CONTRAST TECHNIQUE: Multidetector CT imaging of the head and cervical spine was performed following the standard protocol without intravenous contrast. Multiplanar CT image reconstructions of the cervical spine were also generated. COMPARISON:  Head CT 07/27/2015. FINDINGS: CT HEAD FINDINGS Brain: The patient has an acute left subdural hematoma measuring up to 0.7 cm in thickness. No  midline shift, hydrocephalus, or mass effect due to the hemorrhage. A hyperattenuating lesion along the left side of the falx posteriorly measuring approximately 2.2 x 1.9 cm most consistent with a meningioma is unchanged. Atrophy and chronic microvascular ischemic change are noted. Vascular: No hyperdense vessel or unexpected calcification. Skull: Intact.  No focal lesion. Sinuses/Orbits: Negative. Other: None. CT CERVICAL SPINE FINDINGS Alignment: Trace anterolisthesis C4 on C5 due to facet arthropathy. Skull base and vertebrae: No acute fracture. No primary bone lesion or focal pathologic process. Soft tissues and spinal canal: No prevertebral fluid or swelling. No visible canal hematoma. Disc levels: Severe multilevel loss of disc space height is present. Upper chest: Mild scarring in the lung apices. Other: None. IMPRESSION: Small, acute subdural hemorrhage over the left convexities without associated mass effect. No acute abnormality cervical spine. Unchanged left parafalcine meningioma. Atrophy and chronic microvascular ischemic change. Multilevel cervical spondylosis. Critical Value/emergent results were called by telephone at the time of interpretation on 01/28/2020 at 3:10 pm to provider [facility] , who verbally acknowledged these results. Electronically Signed   By: Inge Rise M.D.   On:  01/28/2020 15:12   CT Cervical Spine Wo Contrast  Result Date: 01/28/2020 CLINICAL DATA:  Status post fall 2 days ago. Headache. Posterior neck pain the dizziness. Initial encounter. EXAM: CT HEAD WITHOUT CONTRAST CT CERVICAL SPINE WITHOUT CONTRAST TECHNIQUE: Multidetector CT imaging of the head and cervical spine was performed following the standard protocol without intravenous contrast. Multiplanar CT image reconstructions of the cervical spine were also generated. COMPARISON:  Head CT 07/27/2015. FINDINGS: CT HEAD FINDINGS Brain: The patient has an acute left subdural hematoma measuring up to 0.7 cm in  thickness. No midline shift, hydrocephalus, or mass effect due to the hemorrhage. A hyperattenuating lesion along the left side of the falx posteriorly measuring approximately 2.2 x 1.9 cm most consistent with a meningioma is unchanged. Atrophy and chronic microvascular ischemic change are noted. Vascular: No hyperdense vessel or unexpected calcification. Skull: Intact.  No focal lesion. Sinuses/Orbits: Negative. Other: None. CT CERVICAL SPINE FINDINGS Alignment: Trace anterolisthesis C4 on C5 due to facet arthropathy. Skull base and vertebrae: No acute fracture. No primary bone lesion or focal pathologic process. Soft tissues and spinal canal: No prevertebral fluid or swelling. No visible canal hematoma. Disc levels: Severe multilevel loss of disc space height is present. Upper chest: Mild scarring in the lung apices. Other: None. IMPRESSION: Small, acute subdural hemorrhage over the left convexities without associated mass effect. No acute abnormality cervical spine. Unchanged left parafalcine meningioma. Atrophy and chronic microvascular ischemic change. Multilevel cervical spondylosis. Critical Value/emergent results were called by telephone at the time of interpretation on 01/28/2020 at 3:10 pm to provider [facility] , who verbally acknowledged these results. Electronically Signed   By: Inge Rise M.D.   On: 01/28/2020 15:12    ____________________________________________   PROCEDURES  Procedure(s) performed:   Procedures  CRITICAL CARE Performed by: Margette Fast Total critical care time: 35 minutes Critical care time was exclusive of separately billable procedures and treating other patients. Critical care was necessary to treat or prevent imminent or life-threatening deterioration. Critical care was time spent personally by me on the following activities: development of treatment plan with patient and/or surrogate as well as nursing, discussions with consultants, evaluation of  patient's response to treatment, examination of patient, obtaining history from patient or surrogate, ordering and performing treatments and interventions, ordering and review of laboratory studies, ordering and review of radiographic studies, pulse oximetry and re-evaluation of patient's condition.  Nanda Quinton, MD Emergency Medicine  ____________________________________________   INITIAL IMPRESSION / ASSESSMENT AND PLAN / ED COURSE  Pertinent labs & imaging results that were available during my care of the patient were reviewed by me and considered in my medical decision making (see chart for details).   Patient presents to the ED with HA and neck pain after fall. CT head and C-spine ordered from the waiting room showing a 7 mm acute SDH without mass effect. Vitamin K ordered from triage. Patient is awake and alert. No focal neuro deficits. Will discuss with neurosurgery but fall occurred over 2 days prior. No K-centra or other more aggressive anticoagulation reversal at this time.   Patient discussed with Dr. Ronnald Ramp. Plan to hold Coumadin for 1 week and can f/u with him in the office for evaluation and repeat CT. With injury occurring > 2 days prior patient has exceeded any inpatient observation period if he had presented initially.   Labs and imaging reviewed. Patient has [facility] appointment scheduled for the AM. Discussed holding coumadin with patient and daughter. Provided in writing as  well.   ____________________________________________  FINAL CLINICAL IMPRESSION(S) / ED DIAGNOSES  Final diagnoses:  Fall, initial encounter  Subdural hematoma ([place])     MEDICATIONS GIVEN DURING THIS VISIT:  Medications  phytonadione (VITAMIN K) 10 mg in dextrose 5 % 50 mL IVPB ( Intravenous Stopped 01/28/20 1905)  acetaminophen (TYLENOL) tablet 1,000 mg (1,000 mg Oral Given 01/28/20 1918)     Note:  This document was prepared using Dragon voice recognition software and may include unintentional  dictation errors.  Nanda Quinton, MD, [facility] Emergency Medicine    Allison Deshotels, Wonda Olds, MD 02/02/20 1054

## 2020-01-28 NOTE — ED Triage Notes (Addendum)
Pt states he fell 2 days ago reaching for his walker-pain to forehead, right side of head, posterior neck with +LOC "don't know" how long-also c/o dizziness and feel he is unable to lift head-NAD-to triage in w/c-pt states he does take a blood thinner

## 2020-01-29 ENCOUNTER — Telehealth: Payer: Self-pay

## 2020-01-29 ENCOUNTER — Other Ambulatory Visit: Payer: Self-pay | Admitting: Student

## 2020-01-29 DIAGNOSIS — S065X9A Traumatic subdural hemorrhage with loss of consciousness of unspecified duration, initial encounter: Secondary | ICD-10-CM

## 2020-01-29 DIAGNOSIS — S065XAA Traumatic subdural hemorrhage with loss of consciousness status unknown, initial encounter: Secondary | ICD-10-CM

## 2020-01-29 NOTE — Telephone Encounter (Signed)
noted 

## 2020-01-29 NOTE — Telephone Encounter (Signed)
Called and spoke w/pt's daughter, they stated that the pt needed to hold their coumadin for a week due to the pt suffering from a fall.  I canceled the coumadin clinic appt and we will wait to hear back from pt as they were instructed by the Dr. Laverta Baltimore (neurosurgeon) stated that they saw some blood on his skull in a cat scan and will be having another one done next week to see if there has been any regression. Will route to the pharmd pool for further review if need be. Pt voiced understanding and informed to contact us if they have any questions and to let us know when they resume coumadin.

## 2020-02-05 ENCOUNTER — Ambulatory Visit
Admission: RE | Admit: 2020-02-05 | Discharge: 2020-02-05 | Disposition: A | Discharge status: Home / Self Care | Payer: Medicare Other | Source: Ambulatory Visit | Attending: Student | Admitting: Student

## 2020-02-05 ENCOUNTER — Other Ambulatory Visit: Payer: Self-pay

## 2020-02-05 DIAGNOSIS — G319 Degenerative disease of nervous system, unspecified: Secondary | ICD-10-CM | POA: Diagnosis not present

## 2020-02-05 DIAGNOSIS — S065X0A Traumatic subdural hemorrhage without loss of consciousness, initial encounter: Secondary | ICD-10-CM | POA: Diagnosis not present

## 2020-02-05 DIAGNOSIS — I6200 Nontraumatic subdural hemorrhage, unspecified: Secondary | ICD-10-CM | POA: Diagnosis not present

## 2020-02-05 DIAGNOSIS — S065XAA Traumatic subdural hemorrhage with loss of consciousness status unknown, initial encounter: Secondary | ICD-10-CM

## 2020-02-05 DIAGNOSIS — R42 Dizziness and giddiness: Secondary | ICD-10-CM | POA: Diagnosis not present

## 2020-02-10 ENCOUNTER — Other Ambulatory Visit: Payer: Self-pay | Admitting: Student

## 2020-02-10 DIAGNOSIS — S065X9A Traumatic subdural hemorrhage with loss of consciousness of unspecified duration, initial encounter: Secondary | ICD-10-CM | POA: Diagnosis not present

## 2020-02-10 DIAGNOSIS — S065XAA Traumatic subdural hemorrhage with loss of consciousness status unknown, initial encounter: Secondary | ICD-10-CM

## 2020-03-11 ENCOUNTER — Ambulatory Visit
Admission: RE | Admit: 2020-03-11 | Discharge: 2020-03-11 | Disposition: A | Discharge status: Home / Self Care | Payer: Medicare Other | Source: Ambulatory Visit | Attending: Student | Admitting: Student

## 2020-03-11 ENCOUNTER — Other Ambulatory Visit: Payer: Self-pay

## 2020-03-11 DIAGNOSIS — S065XAA Traumatic subdural hemorrhage with loss of consciousness status unknown, initial encounter: Secondary | ICD-10-CM

## 2020-03-11 DIAGNOSIS — S065X0A Traumatic subdural hemorrhage without loss of consciousness, initial encounter: Secondary | ICD-10-CM | POA: Diagnosis not present

## 2020-03-11 DIAGNOSIS — G9389 Other specified disorders of brain: Secondary | ICD-10-CM | POA: Diagnosis not present

## 2020-03-11 DIAGNOSIS — R42 Dizziness and giddiness: Secondary | ICD-10-CM | POA: Diagnosis not present

## 2020-03-11 DIAGNOSIS — I6200 Nontraumatic subdural hemorrhage, unspecified: Secondary | ICD-10-CM | POA: Diagnosis not present

## 2020-03-11 DIAGNOSIS — S065X9A Traumatic subdural hemorrhage with loss of consciousness of unspecified duration, initial encounter: Secondary | ICD-10-CM | POA: Diagnosis not present

## 2020-03-12 ENCOUNTER — Telehealth: Payer: Self-pay | Admitting: *Deleted

## 2020-03-12 NOTE — Telephone Encounter (Addendum)
Pt in coumadin clinic follow up. Pt had Ct scan done yesterday (9/30). Called pt and unable to get in touch with pt. If coumadin restarted pt will need to have follow up INR appointment.

## 2020-03-16 ENCOUNTER — Telehealth: Payer: Self-pay | Admitting: *Deleted

## 2020-03-16 NOTE — Telephone Encounter (Signed)
Pt is on the list for the coumadin clinic to follow up. Called pt and LMOM. Pt had a ct of the head on 9/30.

## 2020-03-24 ENCOUNTER — Telehealth: Payer: Self-pay | Admitting: *Deleted

## 2020-03-24 NOTE — Telephone Encounter (Signed)
Called the pt since he is overdue for INR check and has been off Warfarin for over a month, he had a repeat CT on 03/11/2020 and per daughter, Mateo Flow, the physician stated the spot in his head has decreased and has dry blood so they resumed the Warfarin at that time. The pt has been back on Warfarin since 03/11/2020 and she states he has not been willing to come to any appts advised that it is very important to have his INR checked since he resumed almost 2 weeks ago, she stated she will try to bring him in between their busy schedule and the date they are available is 04/01/2020. She is aware that we need to check his INR sooner than later since he resumed weeks ago advised of risk of bleeding, falls, clots, stroke with not being checked sooner. She will call if can come sooner.

## 2020-04-01 ENCOUNTER — Ambulatory Visit (INDEPENDENT_AMBULATORY_CARE_PROVIDER_SITE_OTHER): Payer: Medicare Other

## 2020-04-01 ENCOUNTER — Other Ambulatory Visit: Payer: Self-pay

## 2020-04-01 DIAGNOSIS — I2699 Other pulmonary embolism without acute cor pulmonale: Secondary | ICD-10-CM | POA: Diagnosis not present

## 2020-04-01 DIAGNOSIS — I4892 Unspecified atrial flutter: Secondary | ICD-10-CM | POA: Diagnosis not present

## 2020-04-01 DIAGNOSIS — Z7901 Long term (current) use of anticoagulants: Secondary | ICD-10-CM

## 2020-04-01 LAB — POCT INR: INR: 1.1 — AB (ref 2.0–3.0)

## 2020-04-01 NOTE — Patient Instructions (Signed)
Description   Take 1.5 tablets today and tomorrow, then resume same dosage 1/2 a tablet daily except for 1 tablet on Mondays, Wednesdays and Fridays. Be with consistent with your greens. Recheck INR in 1 week. [phone] [facility] [phone].

## 2020-04-03 ENCOUNTER — Other Ambulatory Visit: Payer: Self-pay | Admitting: Cardiology

## 2020-04-08 ENCOUNTER — Ambulatory Visit (INDEPENDENT_AMBULATORY_CARE_PROVIDER_SITE_OTHER): Payer: Medicare Other | Admitting: *Deleted

## 2020-04-08 ENCOUNTER — Other Ambulatory Visit: Payer: Self-pay

## 2020-04-08 DIAGNOSIS — I2699 Other pulmonary embolism without acute cor pulmonale: Secondary | ICD-10-CM | POA: Diagnosis not present

## 2020-04-08 DIAGNOSIS — I4892 Unspecified atrial flutter: Secondary | ICD-10-CM | POA: Diagnosis not present

## 2020-04-08 DIAGNOSIS — S065X9A Traumatic subdural hemorrhage with loss of consciousness of unspecified duration, initial encounter: Secondary | ICD-10-CM | POA: Diagnosis not present

## 2020-04-08 DIAGNOSIS — Z7901 Long term (current) use of anticoagulants: Secondary | ICD-10-CM

## 2020-04-08 DIAGNOSIS — Z5181 Encounter for therapeutic drug level monitoring: Secondary | ICD-10-CM

## 2020-04-08 LAB — POCT INR: INR: 1.8 — AB (ref 2.0–3.0)

## 2020-04-08 MED ORDER — WARFARIN SODIUM 5 MG PO TABS
ORAL_TABLET | ORAL | 0 refills | Status: DC
Start: 2020-04-08 — End: 2020-06-17

## 2020-04-08 NOTE — Patient Instructions (Signed)
Description   Start taking 1 tablet daily except for 1/2 a tablet on Monday, Wednesday and Friday. Recheck INR in 1 week. [phone] [facility] [phone].

## 2020-04-15 ENCOUNTER — Ambulatory Visit (INDEPENDENT_AMBULATORY_CARE_PROVIDER_SITE_OTHER): Payer: Medicare Other | Admitting: *Deleted

## 2020-04-15 ENCOUNTER — Other Ambulatory Visit: Payer: Self-pay

## 2020-04-15 DIAGNOSIS — I2699 Other pulmonary embolism without acute cor pulmonale: Secondary | ICD-10-CM

## 2020-04-15 DIAGNOSIS — Z7901 Long term (current) use of anticoagulants: Secondary | ICD-10-CM | POA: Diagnosis not present

## 2020-04-15 DIAGNOSIS — I4892 Unspecified atrial flutter: Secondary | ICD-10-CM | POA: Diagnosis not present

## 2020-04-15 LAB — POCT INR: INR: 8 — AB (ref 2.0–3.0)

## 2020-04-15 LAB — PROTIME-INR
INR: 8.5 (ref 0.9–1.2)
Prothrombin Time: 83.8 s — ABNORMAL HIGH (ref 9.1–12.0)

## 2020-04-15 NOTE — Progress Notes (Signed)
Pt's POC INR reading greater then 8. Pt sent to the lab, made aware that he is at a higher risk for bleeding and to seek medical attention if he notices any bleeding. Informed pt to not take any warfarin today and to expect a phone call from [place] later on today with his lab results.   Received lab INR of 8.5- called pt and gave him updated dosing instructions. Pt verbalized understanding.   Called and spoke to pt's daughter Randall Lawrence and made her aware of pt's INR and warfarin dosing change per pt request.

## 2020-04-15 NOTE — Patient Instructions (Addendum)
Description   Called and spoke to pt, instructed him to hold warfarin 11/4, 11/5 and 11/6 and then start taking 1/2 a tablet everyday except for 1 tablet on Mondays and Fridays.  Recheck INR on Monday. [phone] [facility] [phone].

## 2020-04-19 ENCOUNTER — Other Ambulatory Visit: Payer: Self-pay

## 2020-04-19 ENCOUNTER — Ambulatory Visit (INDEPENDENT_AMBULATORY_CARE_PROVIDER_SITE_OTHER): Payer: Medicare Other | Admitting: Pharmacist

## 2020-04-19 DIAGNOSIS — I2699 Other pulmonary embolism without acute cor pulmonale: Secondary | ICD-10-CM

## 2020-04-19 DIAGNOSIS — Z7901 Long term (current) use of anticoagulants: Secondary | ICD-10-CM

## 2020-04-19 DIAGNOSIS — I4892 Unspecified atrial flutter: Secondary | ICD-10-CM

## 2020-04-19 LAB — POCT INR: INR: 2.7 (ref 2.0–3.0)

## 2020-04-19 NOTE — Patient Instructions (Signed)
Start taking 1/2 a tablet everyday except for 1 tablet on Mondays and Fridays.  Recheck INR on Monday. [phone] [facility] [phone].

## 2020-05-12 ENCOUNTER — Other Ambulatory Visit: Payer: Self-pay

## 2020-05-12 ENCOUNTER — Ambulatory Visit (INDEPENDENT_AMBULATORY_CARE_PROVIDER_SITE_OTHER): Payer: Medicare Other | Admitting: *Deleted

## 2020-05-12 DIAGNOSIS — Z7901 Long term (current) use of anticoagulants: Secondary | ICD-10-CM

## 2020-05-12 DIAGNOSIS — Z5181 Encounter for therapeutic drug level monitoring: Secondary | ICD-10-CM

## 2020-05-12 DIAGNOSIS — I2699 Other pulmonary embolism without acute cor pulmonale: Secondary | ICD-10-CM | POA: Diagnosis not present

## 2020-05-12 DIAGNOSIS — I4892 Unspecified atrial flutter: Secondary | ICD-10-CM

## 2020-05-12 LAB — POCT INR: INR: 1.7 — AB (ref 2.0–3.0)

## 2020-05-12 NOTE — Patient Instructions (Signed)
Description    Take 1 tablet today, then continue taking 1/2 a tablet everyday except for 1 tablet on Mondays and Fridays.  Recheck INR in 10 days. . [phone] [facility] [phone].

## 2020-05-21 ENCOUNTER — Other Ambulatory Visit: Payer: Self-pay

## 2020-05-21 ENCOUNTER — Ambulatory Visit (INDEPENDENT_AMBULATORY_CARE_PROVIDER_SITE_OTHER): Payer: Medicare Other | Admitting: *Deleted

## 2020-05-21 DIAGNOSIS — Z7901 Long term (current) use of anticoagulants: Secondary | ICD-10-CM

## 2020-05-21 DIAGNOSIS — I4892 Unspecified atrial flutter: Secondary | ICD-10-CM | POA: Diagnosis not present

## 2020-05-21 DIAGNOSIS — Z5181 Encounter for therapeutic drug level monitoring: Secondary | ICD-10-CM

## 2020-05-21 DIAGNOSIS — I2699 Other pulmonary embolism without acute cor pulmonale: Secondary | ICD-10-CM

## 2020-05-21 LAB — POCT INR: INR: 1.3 — AB (ref 2.0–3.0)

## 2020-05-21 NOTE — Patient Instructions (Addendum)
Description    Take 1.5 tablets today and take 1 tablet tomorrow,  then continue taking 1/2 a tablet everyday except for 1 tablet on Mondays and Fridays.  Recheck INR in 10 days.  [phone] [facility] [phone].

## 2020-05-31 ENCOUNTER — Other Ambulatory Visit: Payer: Self-pay

## 2020-05-31 ENCOUNTER — Ambulatory Visit (INDEPENDENT_AMBULATORY_CARE_PROVIDER_SITE_OTHER): Payer: Medicare Other

## 2020-05-31 DIAGNOSIS — I2699 Other pulmonary embolism without acute cor pulmonale: Secondary | ICD-10-CM

## 2020-05-31 DIAGNOSIS — Z7901 Long term (current) use of anticoagulants: Secondary | ICD-10-CM

## 2020-05-31 DIAGNOSIS — I4892 Unspecified atrial flutter: Secondary | ICD-10-CM

## 2020-05-31 LAB — POCT INR: INR: 2.1 (ref 2.0–3.0)

## 2020-05-31 NOTE — Patient Instructions (Signed)
Description   Continue on same dosage 1/2 a tablet everyday except for 1 tablet on Mondays and Fridays.  Recheck INR in 2 weeks.  [phone] [facility] [phone].

## 2020-06-17 ENCOUNTER — Other Ambulatory Visit: Payer: Self-pay

## 2020-06-17 ENCOUNTER — Ambulatory Visit (INDEPENDENT_AMBULATORY_CARE_PROVIDER_SITE_OTHER): Payer: Medicare Other | Admitting: *Deleted

## 2020-06-17 ENCOUNTER — Telehealth: Payer: Self-pay | Admitting: *Deleted

## 2020-06-17 DIAGNOSIS — Z5181 Encounter for therapeutic drug level monitoring: Secondary | ICD-10-CM

## 2020-06-17 DIAGNOSIS — Z7901 Long term (current) use of anticoagulants: Secondary | ICD-10-CM | POA: Diagnosis not present

## 2020-06-17 DIAGNOSIS — I2699 Other pulmonary embolism without acute cor pulmonale: Secondary | ICD-10-CM

## 2020-06-17 DIAGNOSIS — I4892 Unspecified atrial flutter: Secondary | ICD-10-CM

## 2020-06-17 LAB — POCT INR: INR: 1.6 — AB (ref 2.0–3.0)

## 2020-06-17 MED ORDER — APIXABAN 2.5 MG PO TABS: 2.5000 mg | ORAL_TABLET | Freq: Two times a day (BID) | ORAL | 5 refills | Status: DC

## 2020-06-17 MED ORDER — WARFARIN SODIUM 5 MG PO TABS: ORAL_TABLET | ORAL | 1 refills | Status: DC

## 2020-06-17 NOTE — Patient Instructions (Signed)
Description   Take 1 tablet of warfarin today then continue on same dosage 1/2 a tablet everyday except for 1 tablet on Mondays and Fridays.  Recheck INR in 1 week. [phone] [facility] [phone].

## 2020-06-17 NOTE — Telephone Encounter (Addendum)
Discussed with Dr. Elberta Fortis and Lars Pinks D to review pt's chart. Pt's INR have been labile- mostly subtherapeutic. Okay for pt to switch to Eliquis 2.5 mg BID. Called and spoke to pt and his daughter, Randall Lawrence, informed them that pt is to stop taking warfarin and can start taking Eliquis tonight. Pt confirmed he still has not taken his warfarin today. Instructed for pt to start taking Eliquis 2.5 mg one tablet twice a day about 12 hours apart. Informed them that Eliquis is still an anticoagulant and will take the place of Warfarin. Instructed for pt to continue to avoid alcohol consumption. Informed them that I would cancel  the Coumadin appointment for 06/28/2020. Pt and daughter verbalized understanding. Scr: 0.95 (01/2020)  Weight: 56.7 kg, Age: 87                                                                Message Received: Today Camnitz, Andree Coss, MD  Cory Roughen, RN  Ok to switch to eliquis.        Previous Messages   ----- Message -----  From: Cory Roughen, RN  Sent: 06/17/2020  2:12 PM EST  To: Will Jorja Loa, MD   Hi Dr. Elberta Fortis,   When you have a chance could you please review pt's chart to see if he could be a canidate for a Doac?   HX: Afib, DVT, PE   Per Natale Lay note on 02/27/2018 pt had DVT while on Eliquis.   01/28/2020- Pt went to ED had a fall and SDH. Per phone note on 03/24/2020 pt resumed his warfarin on 03/11/2020.   Thank you!   Revonda Standard

## 2020-06-18 ENCOUNTER — Telehealth: Payer: Self-pay | Admitting: Pharmacist

## 2020-06-18 NOTE — Telephone Encounter (Signed)
Called daughter to ask if she had any questions regarding Eliquis.  Has not picked up yet. Advised it will be more expensive than warfarin so wanted her to be aware.  Daughter will call back if she has any follow up questions or issues with Eliquis

## 2020-06-21 ENCOUNTER — Telehealth: Payer: Self-pay | Admitting: *Deleted

## 2020-06-21 NOTE — Telephone Encounter (Signed)
Pt called today and stated that he has not started taking his Eliquis but did pick it up. He wanted to make sure that Dr. Curt Bears was aware that he had a bleed back in August  and was off his warfarin for over a month. Spoke to Dr. Curt Bears again today who stated he was aware. Reminded pt he was on Eliquis in the past and importance of compliance stressed. Informed pt to watch out for signs of blood clots which include warmth and pain in the extremities. Pt stated that he has not taken any warfarin since appointment on 1/6 but did take 1 tablet of warfarin yesterday. Pt is going to stop warfarin and will start taking Eliquis, 1 tablet every 12 hours. Pt stated that he will start taking Eliquis today. Informed pt to call coumadin clinic if he ever has any questions or has trouble affording his medication.

## 2020-06-24 ENCOUNTER — Telehealth: Payer: Self-pay

## 2020-06-24 NOTE — Telephone Encounter (Addendum)
-----   Message from Lutricia Feil, RN sent at 06/23/2020  1:28 PM EST -----  This patient recently switched to Eliquis, he said his prescription for Eliquis was a little over 70 dollars for a 1 month supply. I spoke with Rutha Bouchard D and I was reaching out to you to see if you thought there could be away to make it more affordable? Thanks so much!

## 2020-06-24 NOTE — Telephone Encounter (Signed)
**Note De-Identified Myeesha Shane Obfuscation** No answer so I left a message on the pts VM asking him to call Jeani Hawking at [phone] at  [facility] office concerning the cost of his Eliquis.

## 2020-07-01 NOTE — Telephone Encounter (Addendum)
Spoke with [employer] D, was able to get pt an Eliquis copay card. Called the copay card into the [facility] pharmacy to have on file for his next refill. Eliquis with the copay card should be $10 dollars for a 30 day supply or $30 for a 90 day supply.   Lyman: [id number], GRP: [id number], RxPCN: [employer], ID: [id number]  Called pt and [facility] for him to call the [facility]- to make him aware of the co pay card.   Called and spoke to pt's daughter, Lowella Bandy, informed her that with the Eliquis co-pay card,  pt's next Eliquis refill should now be $10 for a 1 month supply. Informed her to please call if she has any questions or concerns. She stated she has our number on file. She confirmed pt is taking the Eliquis 1 tablet, twice a day and is no longer taking warfarin.

## 2021-08-04 ENCOUNTER — Telehealth: Payer: Self-pay

## 2021-08-04 NOTE — Telephone Encounter (Signed)
Left generic message to call back about appointment getting scheduled on home phone #

## 2021-08-18 ENCOUNTER — Ambulatory Visit (INDEPENDENT_AMBULATORY_CARE_PROVIDER_SITE_OTHER): Payer: Medicare Other | Admitting: Cardiology

## 2021-08-18 ENCOUNTER — Other Ambulatory Visit: Payer: Self-pay

## 2021-08-18 ENCOUNTER — Encounter: Payer: Self-pay | Admitting: Cardiology

## 2021-08-18 ENCOUNTER — Ambulatory Visit (HOSPITAL_COMMUNITY)
Admission: RE | Admit: 2021-08-18 | Discharge: 2021-08-18 | Disposition: A | Discharge status: Home / Self Care | Payer: Medicare Other | Source: Ambulatory Visit | Attending: Cardiology | Admitting: Cardiology

## 2021-08-18 VITALS — BP 126/80 | HR 74 | Ht 69.0 in | Wt 145.8 lb

## 2021-08-18 DIAGNOSIS — W19XXXA Unspecified fall, initial encounter: Secondary | ICD-10-CM

## 2021-08-18 DIAGNOSIS — Z7901 Long term (current) use of anticoagulants: Secondary | ICD-10-CM | POA: Diagnosis not present

## 2021-08-18 DIAGNOSIS — D320 Benign neoplasm of cerebral meninges: Secondary | ICD-10-CM | POA: Diagnosis not present

## 2021-08-18 DIAGNOSIS — I4821 Permanent atrial fibrillation: Secondary | ICD-10-CM

## 2021-08-18 DIAGNOSIS — X58XXXA Exposure to other specified factors, initial encounter: Secondary | ICD-10-CM | POA: Diagnosis not present

## 2021-08-18 DIAGNOSIS — I998 Other disorder of circulatory system: Secondary | ICD-10-CM | POA: Diagnosis not present

## 2021-08-18 DIAGNOSIS — I493 Ventricular premature depolarization: Secondary | ICD-10-CM | POA: Diagnosis not present

## 2021-08-18 DIAGNOSIS — S0990XA Unspecified injury of head, initial encounter: Secondary | ICD-10-CM | POA: Diagnosis not present

## 2021-08-18 DIAGNOSIS — R4182 Altered mental status, unspecified: Secondary | ICD-10-CM | POA: Insufficient documentation

## 2021-08-18 DIAGNOSIS — G319 Degenerative disease of nervous system, unspecified: Secondary | ICD-10-CM | POA: Diagnosis not present

## 2021-08-18 DIAGNOSIS — G9608 Other cranial cerebrospinal fluid leak: Secondary | ICD-10-CM | POA: Insufficient documentation

## 2021-08-18 NOTE — Patient Instructions (Addendum)
Medication Instructions:  ?Your physician has recommended you make the following change in your medication:  ?Hold Eliquis until you hear back from our office ? ?*If you need a refill on your cardiac medications before your next appointment, please call your pharmacy* ? ? ?Lab Work: ?None ordered ? ? ?Testing/Procedures: ?STAT head CT....... Non-Cardiac CT scanning, (CAT scanning), is a noninvasive, special x-ray that produces cross-sectional images of the body using x-rays and a computer. CT scans help physicians diagnose and treat medical conditions. For some CT exams, a contrast material is used to enhance visibility in the area of the body being studied. CT scans provide greater clarity and reveal more details than regular x-ray exams. ? ? ? ?Follow-Up: ?At [facility], you and your health needs are our priority.  As part of our continuing mission to provide you with exceptional heart care, we have created designated Provider Care Teams.  These Care Teams include your primary Cardiologist (physician) and Advanced Practice Providers (APPs -  Physician Assistants and Nurse Practitioners) who all work together to provide you with the care you need, when you need it. ? ?Remote monitoring is used to monitor your Pacemaker or ICD from home. This monitoring reduces the number of office visits required to check your device to one time per year. It allows [place] to keep an eye on the functioning of your device to ensure it is working properly. You are scheduled for a device check from home on 10/21/2021. You may send your transmission at any time that day. If you have a wireless device, the transmission will be sent automatically. After your physician reviews your transmission, you will receive a postcard with your next transmission date. ? ?Your next appointment:   ?1 year(s) ? ?The format for your next appointment:   ?In Person ? ?Provider:   ?Allegra Lai, MD ? ? ?Thank you for choosing [facility]!! ? ? ?Trinidad Curet, RN ?[phone] ? ? ? ?Other Instructions ?  ?

## 2021-08-18 NOTE — Progress Notes (Signed)
She is  ? ?Electrophysiology Office Note ? ? ?Date:  08/18/2021  ? ?ID:  Randall Lawrence, DOB 23-May-1925, MRN [id number] ? ?PCP:  [facility]  ?Cardiologist:  Angelena Form ?Primary Electrophysiologist:  Cadyn Fann Meredith Leeds, MD   ? ?No chief complaint on file. ? ?  ?History of Present Illness: ?Randall Lawrence is a 88 y.o. male who is being seen today for the evaluation of atrial fibrillation at the request of Richardson Dopp. Presenting today for electrophysiology evaluation.   ? ?He has a history significant for atrial fibrillation and atrial flutter, chronic systolic heart failure, DVTs, hypertension, pulmonary embolism, and Medtronic pacemaker.  His pacemaker is landed 2010 in [place].  He is currently on warfarin.  Echo in 2017 showed an ejection fraction of 50% with mild biatrial enlargement.  He has moderate mitral regurgitation. ? ?2 days ago, he had a fall.  He was standing at his sink washing his hands and he fell backwards.  He hit his head on the door.  He is currently on Eliquis, though it is unclear as to whether or not he has been consistently taking the medication.  Since his fall, he has been less steady.  He does not have headaches or one-sided weakness.  He usually walks with a cane but now has a walker today. ? ?Today, denies symptoms of palpitations, chest pain, shortness of breath, orthopnea, PND, lower extremity edema, claudication, dizziness, presyncope, syncope, bleeding, or neurologic sequela. The patient is tolerating medications without difficulties.  He unfortunately does not take his medications on a regular basis.  He states that sometimes he forgets and other times he does not not want to take his medicines. ? ? ?Past Medical History:  ?Diagnosis Date  ? Alcohol abuse   ? Allergy to IVP dye   ? Atrial fibrillation and flutter ([place])   ? Brain tumor (benign) ([place])   ? Chronic systolic CHF (congestive heart failure) ([place])   ? Echo 5/17 [facility] in [place], [place]):  EF 50, mild BAE,  moderate MR, moderate to severe TR, RVSP 40.6  ? History of recurrent deep vein thrombosis (DVT)   ? hx of pulmonary embolism and DVT in 2016 // recurrent DVT/PE on Apixaban in 2017 // now on Warfarin  ? Hypertension   ? Leukemia, chronic lymphocytic ([place])   ? Mitral regurgitation   ? Pacemaker   ? Pulmonary embolism ([place])   ? Tricuspid regurgitation   ? ?Past Surgical History:  ?Procedure Laterality Date  ? APPENDECTOMY    ? cataracts    ? HERNIA REPAIR    ? ? ? ?Current Outpatient Medications  ?Medication Sig Dispense Refill  ? acetaminophen (TYLENOL) 500 MG tablet Take 1,000 mg by mouth every 6 (six) hours as needed (pain).    ? allopurinol (ZYLOPRIM) 100 MG tablet Take 100 mg by mouth daily.    ? amiodarone (PACERONE) 200 MG tablet Take 1 tablet (200 mg total) by mouth 2 (two) times daily. 180 tablet 1  ? apixaban (ELIQUIS) 2.5 MG TABS tablet Take 1 tablet (2.5 mg total) by mouth 2 (two) times daily. 60 tablet 5  ? brimonidine (ALPHAGAN) 0.2 % ophthalmic solution Place 1 drop into both eyes daily.     ? furosemide (LASIX) 20 MG tablet Take 1 tablet (20 mg total) by mouth daily. 90 tablet 1  ? latanoprost (XALATAN) 0.005 % ophthalmic solution     ? meclizine (ANTIVERT) 12.5 MG tablet Take 1 tablet (12.5 mg total)  by mouth 2 (two) times daily as needed for dizziness. 180 tablet 3  ? metoprolol tartrate (LOPRESSOR) 25 MG tablet Take 1 tablet (25 mg total) by mouth 2 (two) times daily.    ? Multiple Vitamin (MULTIVITAMIN) tablet Take 1 tablet by mouth daily.    ? senna (SENOKOT) 8.6 MG tablet Take 1 tablet by mouth daily as needed.     ? ?No current facility-administered medications for this visit.  ? ? ?Allergies:   Iodine, Ivp dye [iodinated contrast media], Lisinopril, Losartan, and Zocor [simvastatin]  ? ?Social History:  The patient  reports that he has never smoked. He has never used smokeless tobacco. He reports current alcohol use. He reports that he does not use drugs.  ? ?Family History:  The patient's  family history includes CAD in his mother; Peripheral vascular disease in his mother.  ? ?ROS:  Please see the history of present illness.   Otherwise, review of systems is positive for none.   All other systems are reviewed and negative.  ? ?PHYSICAL EXAM: ?VS:  BP 126/80   Pulse 74   Ht '5\' 9"'$  (1.753 m)   Wt 145 lb 12.8 oz (66.1 kg)   SpO2 96%   BMI 21.53 kg/m?  , BMI Body mass index is 21.53 kg/m?. ?GEN: Well nourished, well developed, in no acute distress  ?HEENT: normal  ?Neck: no JVD, carotid bruits, or masses ?Cardiac: RRR; no murmurs, rubs, or gallops,no edema  ?Respiratory:  clear to auscultation bilaterally, normal work of breathing ?GI: soft, nontender, nondistended, + BS ?MS: no deformity or atrophy  ?Skin: warm and dry, device site well healed ?Neuro:  Strength and sensation are intact ?Psych: euthymic mood, full affect ? ?EKG:  EKG is ordered today. ?Personal review of the ekg ordered shows atrial flutter, rate 74, ventricular paced ? ?Personal review of the device interrogation today. Results in [place]  ? ?Recent Labs: ?No results found for requested labs within last 8760 hours.  ? ? ?Lipid Panel  ?No results found for: CHOL, TRIG, HDL, CHOLHDL, VLDL, LDLCALC, LDLDIRECT ? ? ?Wt Readings from Last 3 Encounters:  ?08/18/21 145 lb 12.8 oz (66.1 kg)  ?01/28/20 125 lb (56.7 kg)  ?10/06/19 153 lb (69.4 kg)  ?  ? ? ?Other studies Reviewed: ?Additional studies/ records that were reviewed today include: TTE 2017  ?Review of the above records today demonstrates:  ?- Left ventricle: LV is diffusely hypokenetic, worse at the apex. ?  The cavity size was normal. Wall thickness was normal. Systolic ?  function was mildly to moderately reduced. The estimated ejection ?  fraction was in the range of 40% to 45%. ?- Mitral valve: There was mild regurgitation. ?- Right ventricle: The cavity size was mildly dilated. Systolic ?  function was mildly reduced. ?- Right atrium: The atrium was mildly dilated. ?- Pulmonary  arteries: PA peak pressure: 40 mm Hg (S). ? ?Cardiac monitor 07/01/2019 personally reviewed ?Max 222 bpm 07:50pm, 01/04 ?Min 55 bpm 04:54am, 01/06 ?Avg 73 bpm ?29.6% PVCs ?Rare PACs ?1 run of VT, 10 beats at 205 BPM ?Predominant rhythm atrial flutter ?  ?ASSESSMENT AND PLAN: ? ?1.  Permanent atrial fibrillation: Currently on warfarin.  Status post Medtronic dual-chamber pacemaker.  Device functioning appropriately.  CHA2DS2-VASc of 3. ? ?2.  Hypertension: Currently well controlled ? ?3.  DVTs: Continue warfarin per primary care. ? ?4.  PVCs: Burden at almost 30%.  Currently on amiodarone.  High risk medication monitoring.  Currently ventricular paced and  doing well on amiodarone.  No changes. ? ?5.  Fall: Patient fell backwards and hit his head.  He is less stable now.  We Takyla Kuchera order a stat noncontrasted head CT to determine if he has any bleeding.  We Kymia Simi have him hold his Eliquis until his CT scan is done. ? ?Current medicines are reviewed at length with the patient today.   ?The patient does not have concerns regarding his medicines.  The following changes were made today: None ? ?Labs/ tests ordered today include:  ?Orders Placed This Encounter  ?Procedures  ? CT HEAD WO CONTRAST (5MM)  ? EKG 12-Lead  ? ? ? ?Disposition:   FU with Jordana Dugue 1 year ? ?Signed, ?Annica Marinello Meredith Leeds, MD  ?08/18/2021 4:02 PM    ? ?[facility] ?[address] ?[address] ?[place] [address] ?[phone] (office) ?[phone] (fax) ?

## 2021-08-31 ENCOUNTER — Telehealth: Payer: Self-pay

## 2021-08-31 NOTE — Telephone Encounter (Signed)
Will Meredith Leeds, MD  ?08/19/2021  7:40 AM EST Back to Top  ?  ?Head CT without acute abnormality. Forward results for PCP for stable meningioma  ? ?

## 2021-08-31 NOTE — Telephone Encounter (Signed)
Attempted to call Pt at home and cell number. ? ?Was able to leave detailed message on home phone only.   ? ?Advised to restart Eliquis. ? ?Sent results to [place].   ? ?Mailing results to Pt with instruction to resume Eliquis. ?

## 2021-08-31 NOTE — Telephone Encounter (Signed)
-----   Message from Will Meredith Leeds, MD sent at 08/31/2021  1:52 PM EDT ----- ?Resume eliquis ?----- Message ----- ?From: Damian Leavell, RN ?Sent: 08/30/2021   4:25 PM EDT ?To: Will Meredith Leeds, MD, Damian Leavell, RN ? ?Do you want him to resume his Eliquis? ? ?

## 2021-09-01 NOTE — Telephone Encounter (Signed)
Outreach made to Pt's daughter. ? ?Pt's daughter is aware that head CT was with acute abnormality and Pt should restart his Eliquis. ? ?No further action needed. ? ? ?

## 2022-01-26 ENCOUNTER — Other Ambulatory Visit: Payer: Self-pay | Admitting: Student

## 2022-01-26 DIAGNOSIS — S065XAA Traumatic subdural hemorrhage with loss of consciousness status unknown, initial encounter: Secondary | ICD-10-CM

## 2022-02-24 ENCOUNTER — Ambulatory Visit
Admission: RE | Admit: 2022-02-24 | Discharge: 2022-02-24 | Disposition: A | Discharge status: Home / Self Care | Payer: Medicare Other | Source: Ambulatory Visit | Attending: Student | Admitting: Student

## 2022-02-24 DIAGNOSIS — S065XAA Traumatic subdural hemorrhage with loss of consciousness status unknown, initial encounter: Secondary | ICD-10-CM

## 2022-04-10 ENCOUNTER — Ambulatory Visit: Payer: Medicare Other | Attending: Cardiology

## 2022-04-10 DIAGNOSIS — Z950 Presence of cardiac pacemaker: Secondary | ICD-10-CM

## 2022-04-10 DIAGNOSIS — I420 Dilated cardiomyopathy: Secondary | ICD-10-CM | POA: Diagnosis present

## 2022-04-11 LAB — CUP PACEART REMOTE DEVICE CHECK
Battery Impedance: 3180 Ohm
Battery Remaining Longevity: 28 mo
Battery Voltage: 2.73 V
Brady Statistic RV Percent Paced: 77 %
Date Time Interrogation Session: 20231028105050
Implantable Lead Connection Status: 753985
Implantable Lead Connection Status: 753985
Implantable Lead Implant Date: 20100917
Implantable Lead Implant Date: 20100917
Implantable Lead Location: 753859
Implantable Lead Location: 753860
Implantable Lead Model: 4076
Implantable Lead Model: 4076
Implantable Pulse Generator Implant Date: 20100917
Lead Channel Impedance Value: 430 Ohm
Lead Channel Impedance Value: 67 Ohm
Lead Channel Pacing Threshold Amplitude: 0.375 V
Lead Channel Pacing Threshold Pulse Width: 0.4 ms
Lead Channel Setting Pacing Amplitude: 2 V
Lead Channel Setting Pacing Pulse Width: 0.4 ms
Lead Channel Setting Sensing Sensitivity: 4 mV
Zone Setting Status: 755011
Zone Setting Status: 755011

## 2022-05-13 NOTE — Progress Notes (Signed)
Remote pacemaker transmission.   

## 2022-06-26 ENCOUNTER — Telehealth: Payer: Self-pay | Admitting: Cardiology

## 2022-06-26 ENCOUNTER — Other Ambulatory Visit: Payer: Self-pay

## 2022-06-26 MED ORDER — APIXABAN 2.5 MG PO TABS: 2.5000 mg | ORAL_TABLET | Freq: Two times a day (BID) | ORAL | 1 refills | Status: DC

## 2022-06-26 NOTE — Telephone Encounter (Signed)
Prescription refill request for Eliquis received. Indication:aflutter Last office visit:3/23 Scr:0.9 Age: 89 Weight:66.1  kg  Prescription refilled

## 2022-06-26 NOTE — Telephone Encounter (Signed)
*  STAT* If patient is at the pharmacy, call can be transferred to refill team.   1. Which medications need to be refilled? (please list name of each medication and dose if known) apixaban (ELIQUIS) 2.5 MG TABS tablet  2. Which pharmacy/location (including street and city if local pharmacy) is medication to be sent to? [place]   3. Do they need a 30 day or 90 day supply?  90 day supply

## 2022-07-10 ENCOUNTER — Ambulatory Visit: Payer: Medicare Other

## 2022-07-20 ENCOUNTER — Ambulatory Visit (INDEPENDENT_AMBULATORY_CARE_PROVIDER_SITE_OTHER): Payer: Medicare Other

## 2022-07-20 DIAGNOSIS — I420 Dilated cardiomyopathy: Secondary | ICD-10-CM | POA: Diagnosis not present

## 2022-07-21 LAB — CUP PACEART REMOTE DEVICE CHECK
Battery Impedance: 3478 Ohm
Battery Remaining Longevity: 25 mo
Battery Voltage: 2.73 V
Brady Statistic RV Percent Paced: 78 %
Date Time Interrogation Session: 20240208181727
Implantable Lead Connection Status: 753985
Implantable Lead Connection Status: 753985
Implantable Lead Implant Date: 20100917
Implantable Lead Implant Date: 20100917
Implantable Lead Location: 753859
Implantable Lead Location: 753860
Implantable Lead Model: 4076
Implantable Lead Model: 4076
Implantable Pulse Generator Implant Date: 20100917
Lead Channel Impedance Value: 419 Ohm
Lead Channel Impedance Value: 67 Ohm
Lead Channel Pacing Threshold Amplitude: 0.375 V
Lead Channel Pacing Threshold Pulse Width: 0.4 ms
Lead Channel Setting Pacing Amplitude: 2 V
Lead Channel Setting Pacing Pulse Width: 0.4 ms
Lead Channel Setting Sensing Sensitivity: 4 mV
Zone Setting Status: 755011
Zone Setting Status: 755011

## 2022-08-10 NOTE — Progress Notes (Signed)
Remote pacemaker transmission.   

## 2022-09-05 ENCOUNTER — Encounter: Payer: Self-pay | Admitting: Cardiology

## 2022-09-05 ENCOUNTER — Ambulatory Visit: Payer: Medicare Other | Attending: Cardiology | Admitting: Cardiology

## 2022-09-05 VITALS — BP 120/64 | HR 65 | Ht 69.0 in | Wt 145.0 lb

## 2022-09-05 DIAGNOSIS — Z79899 Other long term (current) drug therapy: Secondary | ICD-10-CM | POA: Diagnosis not present

## 2022-09-05 DIAGNOSIS — I493 Ventricular premature depolarization: Secondary | ICD-10-CM

## 2022-09-05 DIAGNOSIS — D6869 Other thrombophilia: Secondary | ICD-10-CM

## 2022-09-05 DIAGNOSIS — I4821 Permanent atrial fibrillation: Secondary | ICD-10-CM | POA: Diagnosis not present

## 2022-09-05 MED ORDER — FUROSEMIDE 20 MG PO TABS: 20.0000 mg | ORAL_TABLET | Freq: Every day | ORAL | 3 refills | Status: DC

## 2022-09-05 MED ORDER — METOPROLOL TARTRATE 25 MG PO TABS: 25.0000 mg | ORAL_TABLET | Freq: Two times a day (BID) | ORAL | 3 refills | Status: DC

## 2022-09-05 MED ORDER — APIXABAN 2.5 MG PO TABS: 2.5000 mg | ORAL_TABLET | Freq: Two times a day (BID) | ORAL | 3 refills | Status: DC

## 2022-09-05 MED ORDER — AMIODARONE HCL 200 MG PO TABS: 200.0000 mg | ORAL_TABLET | Freq: Every day | ORAL | 3 refills | Status: AC

## 2022-09-05 NOTE — Patient Instructions (Signed)
Medication Instructions:  DECREASE Amiodarone to 200mg  once daily  *If you need a refill on your cardiac medications before your next appointment, please call your pharmacy*   Lab Work: TSH, CMP, and CBC TODAY If you have labs (blood work) drawn today and your tests are completely normal, you will receive your results only by: [facility] (if you have MyChart) OR A paper copy in the mail If you have any lab test that is abnormal or we need to change your treatment, we will call you to review the results.   Follow-Up: At [facility], you and your health needs are our priority.  As part of our continuing mission to provide you with exceptional heart care, we have created designated Provider Care Teams.  These Care Teams include your primary Cardiologist (physician) and Advanced Practice Providers (APPs -  Physician Assistants and Nurse Practitioners) who all work together to provide you with the care you need, when you need it.  We recommend signing up for the patient portal called "MyChart".  Sign up information is provided on this After Visit Summary.  MyChart is used to connect with patients for Virtual Visits (Telemedicine).  Patients are able to view lab/test results, encounter notes, upcoming appointments, etc.  Non-urgent messages can be sent to your provider as well.   To learn more about what you can do with MyChart, go to [email or web address].    Your next appointment:   1 year(s) in [place]  Provider:   Allegra Lai, MD

## 2022-09-05 NOTE — Progress Notes (Signed)
She is   Electrophysiology Office Note   Date:  09/05/2022   ID:  Randall, Lawrence 11-Mar-1925, MRN [id number]  PCP:  [place]  Cardiologist:  Randall Lawrence Primary Electrophysiologist:  Randall Meneely Meredith Leeds, MD    No chief complaint on file.    History of Present Illness: Randall Lawrence is a 89 y.o. male who is being seen today for the evaluation of atrial fibrillation at the request of Randall Lawrence. Presenting today for electrophysiology evaluation.    He has a history significant for atrial fibrillation and atrial flutter, chronic systolic heart failure, DVTs, hypertension, pulmonary embolism, Medtronic pacemaker.  His pacemaker was implanted in 2010 in [place].  He is on warfarin for atrial fibrillation.  He was found to have an elevated PVC burden at 30%.  He is now on amiodarone.  Today, denies symptoms of palpitations, chest pain, shortness of breath, orthopnea, PND, lower extremity edema, claudication, dizziness, presyncope, syncope, bleeding, or neurologic sequela. The patient is tolerating medications without difficulties.      Past Medical History:  Diagnosis Date   Alcohol abuse    Allergy to IVP dye    Atrial fibrillation and flutter ([facility])    Brain tumor (benign) ([facility])    Chronic systolic CHF (congestive heart failure) ([place])    Echo 5/17 [facility] in [place], [place]):  EF 50, mild BAE, moderate MR, moderate to severe TR, RVSP 40.6   History of recurrent deep vein thrombosis (DVT)    hx of pulmonary embolism and DVT in 2016 // recurrent DVT/PE on Apixaban in 2017 // now on Warfarin   Hypertension    Leukemia, chronic lymphocytic (H[facility])    Mitral regurgitation    Pacemaker    Pulmonary embolism ([facility])    Tricuspid regurgitation    Past Surgical History:  Procedure Laterality Date   APPENDECTOMY     cataracts     HERNIA REPAIR       Current Outpatient Medications  Medication Sig Dispense Refill   acetaminophen (TYLENOL) 500 MG tablet Take 1,000  mg by mouth every 6 (six) hours as needed (pain).     allopurinol (ZYLOPRIM) 100 MG tablet Take 100 mg by mouth daily.     brimonidine (ALPHAGAN) 0.2 % ophthalmic solution Place 1 drop into both eyes daily.      latanoprost (XALATAN) 0.005 % ophthalmic solution      meclizine (ANTIVERT) 12.5 MG tablet Take 1 tablet (12.5 mg total) by mouth 2 (two) times daily as needed for dizziness. 180 tablet 3   Multiple Vitamin (MULTIVITAMIN) tablet Take 1 tablet by mouth daily.     senna (SENOKOT) 8.6 MG tablet Take 1 tablet by mouth daily as needed.      amiodarone (PACERONE) 200 MG tablet Take 1 tablet (200 mg total) by mouth daily. 90 tablet 3   apixaban (ELIQUIS) 2.5 MG TABS tablet Take 1 tablet (2.5 mg total) by mouth 2 (two) times daily. 180 tablet 3   furosemide (LASIX) 20 MG tablet Take 1 tablet (20 mg total) by mouth daily. 90 tablet 3   metoprolol tartrate (LOPRESSOR) 25 MG tablet Take 1 tablet (25 mg total) by mouth 2 (two) times daily. 180 tablet 3   No current facility-administered medications for this visit.    Allergies:   Iodine, Ivp dye [iodinated contrast media], Lisinopril, Losartan, and Zocor [simvastatin]   Social History:  The patient  reports that he has never smoked. He has never used smokeless tobacco.  He reports current alcohol use. He reports that he does not use drugs.   Family History:  The patient's family history includes CAD in his mother; Peripheral vascular disease in his mother.   ROS:  Please see the history of present illness.   Otherwise, review of systems is positive for none.   All other systems are reviewed and negative.   PHYSICAL EXAM: VS:  BP 120/64   Pulse 65   Ht 5\' 9"  (1.753 m)   Wt 145 lb (65.8 kg)   SpO2 98%   BMI 21.41 kg/m  , BMI Body mass index is 21.41 kg/m. GEN: Well nourished, well developed, in no acute distress  HEENT: normal  Neck: no JVD, carotid bruits, or masses Cardiac: RRR; no murmurs, rubs, or gallops,no edema  Respiratory:   clear to auscultation bilaterally, normal work of breathing GI: soft, nontender, nondistended, + BS MS: no deformity or atrophy  Skin: warm and dry, device site well healed Neuro:  Strength and sensation are intact Psych: euthymic mood, full affect  EKG:  EKG is ordered today. Personal review of the ekg ordered shows atrial flutter, right bundle branch block  Personal review of the device interrogation today. Results in [place]: No results found for requested labs within last 365 days.    Lipid Panel  No results found for: "CHOL", "TRIG", "HDL", "CHOLHDL", "VLDL", "LDLCALC", "LDLDIRECT"   Wt Readings from Last 3 Encounters:  09/05/22 145 lb (65.8 kg)  08/18/21 145 lb 12.8 oz (66.1 kg)  01/28/20 125 lb (56.7 kg)      Other studies Reviewed: Additional studies/ records that were reviewed today include: TTE 2017  Review of the above records today demonstrates:  - Left ventricle: LV is diffusely hypokenetic, worse at the apex.   The cavity size was normal. Wall thickness was normal. Systolic   function was mildly to moderately reduced. The estimated ejection   fraction was in the range of 40% to 45%. - Mitral valve: There was mild regurgitation. - Right ventricle: The cavity size was mildly dilated. Systolic   function was mildly reduced. - Right atrium: The atrium was mildly dilated. - Pulmonary arteries: PA peak pressure: 40 mm Hg (S).  Cardiac monitor 07/01/2019 personally reviewed Max 222 bpm 07:50pm, 01/04 Min 55 bpm 04:54am, 01/06 Avg 73 bpm 29.6% PVCs Rare PACs 1 run of VT, 10 beats at 205 BPM Predominant rhythm atrial flutter   ASSESSMENT AND PLAN:  1.  Permanent atrial fibrillation currently on warfarin.  Status post Medtronic dual-chamber pacemaker.  Device functioning appropriately.  CHA2DS2-VASc of 3.  2.  Hypertension: Currently well-controlled  3.  DVTs: Currently on warfarin per primary care  4.  PVCs: Burden of 30%.  Currently on  amiodarone no PVCs on EKG today.  Continue monitoring.  5.  Secondary hypercoagulable state: Currently on warfarin for atrial fibrillation  6.  High risk medication monitoring: Currently on amiodarone.  Lisett Dirusso check TSH and LFTs today.  Current medicines are reviewed at length with the patient today.   The patient does not have concerns regarding his medicines.  The following changes were made today: none  Labs/ tests ordered today include:  Orders Placed This Encounter  Procedures   Comprehensive metabolic panel   CBC   TSH   EKG 12-Lead     Disposition:   FU 1 year  Signed, Emmer Lillibridge Meredith Leeds, MD  09/05/2022 2:30 PM     [facility]  [address] [address] [phone] (office) [phone] (fax)

## 2022-09-06 LAB — COMPREHENSIVE METABOLIC PANEL
ALT: <5 IU/L (ref 0–44)
AST: 18 IU/L (ref 0–40)
Albumin/Globulin Ratio: 2.1 (ref 1.2–2.2)
Albumin: 4.7 g/dL — ABNORMAL HIGH (ref 3.6–4.6)
Alkaline Phosphatase: 108 IU/L (ref 44–121)
BUN/Creatinine Ratio: 18 (ref 10–24)
BUN: 21 mg/dL (ref 10–36)
Bilirubin Total: 0.5 mg/dL (ref 0.0–1.2)
CO2: 20 mmol/L (ref 20–29)
Calcium: 10.1 mg/dL (ref 8.6–10.2)
Chloride: 103 mmol/L (ref 96–106)
Creatinine, Ser: 1.18 mg/dL (ref 0.76–1.27)
Globulin, Total: 2.2 g/dL (ref 1.5–4.5)
Glucose: 72 mg/dL (ref 70–99)
Potassium: 4 mmol/L (ref 3.5–5.2)
Sodium: 145 mmol/L — ABNORMAL HIGH (ref 134–144)
Total Protein: 6.9 g/dL (ref 6.0–8.5)
eGFR: 57 mL/min/{1.73_m2} — ABNORMAL LOW (ref >59)

## 2022-09-06 LAB — CBC
Hematocrit: 37.4 % — ABNORMAL LOW (ref 37.5–51.0)
Hemoglobin: 12.5 g/dL — ABNORMAL LOW (ref 13.0–17.7)
MCH: 28.2 pg (ref 26.6–33.0)
MCHC: 33.4 g/dL (ref 31.5–35.7)
MCV: 84 fL (ref 79–97)
Platelets: 213 10*3/uL (ref 150–450)
RBC: 4.43 x10E6/uL (ref 4.14–5.80)
RDW: 14.3 % (ref 11.6–15.4)
WBC: 9.9 10*3/uL (ref 3.4–10.8)

## 2022-09-06 LAB — TSH: TSH: 1.95 u[IU]/mL (ref 0.450–4.500)

## 2022-10-09 ENCOUNTER — Ambulatory Visit: Payer: Medicare Other

## 2022-11-09 ENCOUNTER — Ambulatory Visit (INDEPENDENT_AMBULATORY_CARE_PROVIDER_SITE_OTHER): Payer: Medicare Other

## 2022-11-09 DIAGNOSIS — I420 Dilated cardiomyopathy: Secondary | ICD-10-CM | POA: Diagnosis not present

## 2022-11-09 LAB — CUP PACEART REMOTE DEVICE CHECK
Battery Impedance: 3847 Ohm
Battery Remaining Longevity: 22 mo
Battery Voltage: 2.71 V
Brady Statistic RV Percent Paced: 83 %
Date Time Interrogation Session: 20240530131250
Implantable Lead Connection Status: 753985
Implantable Lead Connection Status: 753985
Implantable Lead Implant Date: 20100917
Implantable Lead Implant Date: 20100917
Implantable Lead Location: 753859
Implantable Lead Location: 753860
Implantable Lead Model: 4076
Implantable Lead Model: 4076
Implantable Pulse Generator Implant Date: 20100917
Lead Channel Impedance Value: 424 Ohm
Lead Channel Impedance Value: 67 Ohm
Lead Channel Pacing Threshold Amplitude: 0.375 V
Lead Channel Pacing Threshold Pulse Width: 0.4 ms
Lead Channel Setting Pacing Amplitude: 2 V
Lead Channel Setting Pacing Pulse Width: 0.4 ms
Lead Channel Setting Sensing Sensitivity: 2.8 mV
Zone Setting Status: 755011
Zone Setting Status: 755011

## 2022-11-28 NOTE — Progress Notes (Signed)
Remote pacemaker transmission.   

## 2023-01-08 ENCOUNTER — Ambulatory Visit: Payer: Medicare Other

## 2023-04-09 ENCOUNTER — Ambulatory Visit: Payer: Medicare Other

## 2023-05-15 ENCOUNTER — Telehealth: Payer: Self-pay | Admitting: Cardiology

## 2023-05-15 NOTE — Telephone Encounter (Signed)
Patient's daughter is calling to inform us that they were unable to send a transmission to [place]. Patient's daughter stated she call the [employer] and they are shipping a new battery. Patient's daughter stated it should take about 7-10 before she receives the battery. Once she receives the battery she will send a manual transmission.

## 2023-08-16 ENCOUNTER — Other Ambulatory Visit (HOSPITAL_COMMUNITY): Payer: Self-pay | Admitting: Neurological Surgery

## 2023-08-16 DIAGNOSIS — S065XAA Traumatic subdural hemorrhage with loss of consciousness status unknown, initial encounter: Secondary | ICD-10-CM

## 2023-08-24 ENCOUNTER — Ambulatory Visit (HOSPITAL_COMMUNITY)
Admission: RE | Admit: 2023-08-24 | Discharge: 2023-08-24 | Disposition: A | Discharge status: Home / Self Care | Source: Ambulatory Visit | Attending: Neurological Surgery | Admitting: Neurological Surgery

## 2023-08-24 DIAGNOSIS — S065XAA Traumatic subdural hemorrhage with loss of consciousness status unknown, initial encounter: Secondary | ICD-10-CM | POA: Diagnosis present

## 2023-09-17 ENCOUNTER — Ambulatory Visit: Payer: Medicare Other | Attending: Cardiology | Admitting: Cardiology

## 2023-09-17 ENCOUNTER — Encounter: Payer: Self-pay | Admitting: Cardiology

## 2023-09-17 VITALS — BP 116/62 | HR 68 | Ht 69.0 in | Wt 151.0 lb

## 2023-09-17 DIAGNOSIS — I493 Ventricular premature depolarization: Secondary | ICD-10-CM

## 2023-09-17 DIAGNOSIS — D6869 Other thrombophilia: Secondary | ICD-10-CM | POA: Diagnosis not present

## 2023-09-17 DIAGNOSIS — I495 Sick sinus syndrome: Secondary | ICD-10-CM | POA: Diagnosis not present

## 2023-09-17 DIAGNOSIS — I4821 Permanent atrial fibrillation: Secondary | ICD-10-CM | POA: Diagnosis not present

## 2023-09-17 DIAGNOSIS — I10 Essential (primary) hypertension: Secondary | ICD-10-CM

## 2023-09-17 DIAGNOSIS — Z79899 Other long term (current) drug therapy: Secondary | ICD-10-CM

## 2023-09-17 LAB — CUP PACEART INCLINIC DEVICE CHECK
Battery Impedance: 6778 Ohm
Battery Remaining Longevity: 5 mo
Battery Voltage: 2.63 V
Brady Statistic RV Percent Paced: 92 %
Date Time Interrogation Session: 20250407155617
Implantable Lead Connection Status: 753985
Implantable Lead Connection Status: 753985
Implantable Lead Implant Date: 20100917
Implantable Lead Implant Date: 20100917
Implantable Lead Location: 753859
Implantable Lead Location: 753860
Implantable Lead Model: 4076
Implantable Lead Model: 4076
Implantable Pulse Generator Implant Date: 20100917
Lead Channel Impedance Value: 465 Ohm
Lead Channel Impedance Value: 67 Ohm
Lead Channel Pacing Threshold Amplitude: 0.375 V
Lead Channel Pacing Threshold Pulse Width: 0.4 ms
Lead Channel Sensing Intrinsic Amplitude: 11.2 mV
Lead Channel Setting Pacing Amplitude: 2 V
Lead Channel Setting Pacing Pulse Width: 0.4 ms
Lead Channel Setting Sensing Sensitivity: 4 mV
Zone Setting Status: 755011
Zone Setting Status: 755011

## 2023-09-17 NOTE — Patient Instructions (Addendum)
 Medication Instructions:  Your physician has recommended you make the following change in your medication:  STOP Metoprolol  *If you need a refill on your cardiac medications before your next appointment, please call your pharmacy*   Lab Work: Today:  CMET, TSH and CBC  If you have any lab test that is abnormal or we need to change your treatment, we will call you to review the results.   Testing/Procedures: None ordered   Follow-Up: At [facility], you and your health needs are our priority.  As part of our continuing mission to provide you with exceptional heart care, we have created designated Provider Care Teams.  These Care Teams include your primary Cardiologist (physician) and Advanced Practice Providers (APPs -  Physician Assistants and Nurse Practitioners) who all work together to provide you with the care you need, when you need it.  Your next appointment:   2 week(s) after your battery change  The format for your next appointment:   In Person  Provider:   Device clinic for a wound check {    Thank you for choosing [facility]!!   Dory Horn, RN [phone]

## 2023-09-17 NOTE — Progress Notes (Signed)
  Electrophysiology Office Note:   Date:  09/17/2023  ID:  Randall Lawrence, DOB Sep 09, 1924, MRN [id number]  Primary Cardiologist: Verne Carrow, MD Primary Heart Failure: None Electrophysiologist: Drema Eddington Jorja Loa, MD      History of Present Illness:   Randall Lawrence is a 89 y.o. male with h/o atrial fibrillation/flutter, chronic systolic heart failure, diabetes, hypertension, PE, symptomatic bradycardia seen today for routine electrophysiology followup.   Since last being seen in our clinic the patient reports episodes of dizziness.  He gets dizzy when he stands up too quickly.  He is also dizzy at rest when sitting down.  It has been going on for quite some time.  He was prescribed initially a medication by his primary physician which gave him some mild improvement in symptoms..  he denies chest pain, palpitations, dyspnea, PND, orthopnea, nausea, vomiting, syncope, edema, weight gain, or early satiety.   Review of systems complete and found to be negative unless listed in HPI.      EP Information / Studies Reviewed:    EKG is ordered today. Personal review as below.  Atrial flutter, ventricular paced  PPM Interrogation-  reviewed in detail today,  See PACEART report.  Device History: Medtronic Dual Chamber PPM implanted for Symptomatic bradycardia  Risk Assessment/Calculations:    CHA2DS2-VASc Score = 4   This indicates a 4.8% annual risk of stroke. The patient's score is based upon: CHF History: 1 HTN History: 1 Diabetes History: 0 Stroke History: 0 Vascular Disease History: 0 Age Score: 2 Gender Score: 0            Physical Exam:   VS:  BP 116/62   Pulse 68   Ht 5\' 9"  (1.753 m)   Wt 151 lb 0.6 oz (68.5 kg)   SpO2 92%   BMI 22.30 kg/m    Wt Readings from Last 3 Encounters:  09/17/23 151 lb 0.6 oz (68.5 kg)  09/05/22 145 lb (65.8 kg)  08/18/21 145 lb 12.8 oz (66.1 kg)     GEN: Well nourished, well developed in no acute distress NECK: No JVD; No  carotid bruits CARDIAC: Regular rate and rhythm, no murmurs, rubs, gallops RESPIRATORY:  Clear to auscultation without rales, wheezing or rhonchi  ABDOMEN: Soft, non-tender, non-distended EXTREMITIES:  No edema; No deformity   ASSESSMENT AND PLAN:    Symptomatic bradycardia s/p Medtronic PPM  Normal PPM function See Pace Art report Sensing, threshold, impedance within normal limits Programming reviewed and appropriate for patient care No changes today Patient is 5 risk benefits of GEN change.  Risk include bleeding and fracture.  The patient understands the risks and is agreed to the procedure.  Explained risks, benefits, and alternatives to generator change, including but not limited to bleeding and infection. Pt verbalized understanding and agrees to proceed.  2.  Hypertension: Currently well-controlled  3.  PVCs: 30% burden.  Currently on amiodarone.  Noticing  4.  High-risk medication monitoring: Currently on amiodarone. Zarion Oliff check TSH and LFTs today.  5.  Permanent atrial fibrillation: Rate controlled.  Continue with current management.  6.  Secondary hypercoagulable state: Currently on warfarin for atrial fibrillation  7.  Dizziness: Seems somewhat orthostatic based on symptoms but does have dizziness at rest.  Randall Lawrence stop metoprolol.  Disposition:   Follow up with Dr. Elberta Fortis in 12 months  Signed, Tahirih Lair Jorja Loa, MD

## 2023-09-18 LAB — COMPREHENSIVE METABOLIC PANEL WITH GFR
ALT: <5 IU/L (ref 0–44)
AST: 19 IU/L (ref 0–40)
Albumin: 4.9 g/dL — ABNORMAL HIGH (ref 3.6–4.6)
Alkaline Phosphatase: 111 IU/L (ref 44–121)
BUN/Creatinine Ratio: 18 (ref 10–24)
BUN: 23 mg/dL (ref 10–36)
Bilirubin Total: 0.5 mg/dL (ref 0.0–1.2)
CO2: 21 mmol/L (ref 20–29)
Calcium: 10.4 mg/dL — ABNORMAL HIGH (ref 8.6–10.2)
Chloride: 104 mmol/L (ref 96–106)
Creatinine, Ser: 1.29 mg/dL — ABNORMAL HIGH (ref 0.76–1.27)
Globulin, Total: 2.4 g/dL (ref 1.5–4.5)
Glucose: 94 mg/dL (ref 70–99)
Potassium: 5.3 mmol/L — ABNORMAL HIGH (ref 3.5–5.2)
Sodium: 144 mmol/L (ref 134–144)
Total Protein: 7.3 g/dL (ref 6.0–8.5)
eGFR: 51 mL/min/{1.73_m2} — ABNORMAL LOW (ref >59)

## 2023-09-18 LAB — CBC
Hematocrit: 42.8 % (ref 37.5–51.0)
Hemoglobin: 13.9 g/dL (ref 13.0–17.7)
MCH: 27 pg (ref 26.6–33.0)
MCHC: 32.5 g/dL (ref 31.5–35.7)
MCV: 83 fL (ref 79–97)
Platelets: 204 10*3/uL (ref 150–450)
RBC: 5.15 x10E6/uL (ref 4.14–5.80)
RDW: 13.3 % (ref 11.6–15.4)
WBC: 13.2 10*3/uL — ABNORMAL HIGH (ref 3.4–10.8)

## 2023-09-18 LAB — TSH: TSH: 2.24 u[IU]/mL (ref 0.450–4.500)

## 2023-11-08 ENCOUNTER — Ambulatory Visit: Payer: Medicare Other

## 2023-11-08 DIAGNOSIS — I495 Sick sinus syndrome: Secondary | ICD-10-CM

## 2023-11-14 ENCOUNTER — Telehealth: Payer: Self-pay

## 2023-11-14 DIAGNOSIS — I495 Sick sinus syndrome: Secondary | ICD-10-CM

## 2023-11-14 DIAGNOSIS — I4821 Permanent atrial fibrillation: Secondary | ICD-10-CM

## 2023-11-14 LAB — CUP PACEART REMOTE DEVICE CHECK
Battery Impedance: 8262 Ohm
Battery Voltage: 2.6 V
Brady Statistic RV Percent Paced: 97 %
Date Time Interrogation Session: 20250603220123
Implantable Lead Connection Status: 753985
Implantable Lead Connection Status: 753985
Implantable Lead Implant Date: 20100917
Implantable Lead Implant Date: 20100917
Implantable Lead Location: 753859
Implantable Lead Location: 753860
Implantable Lead Model: 4076
Implantable Lead Model: 4076
Implantable Pulse Generator Implant Date: 20100917
Lead Channel Impedance Value: 418 Ohm
Lead Channel Impedance Value: 67 Ohm
Lead Channel Setting Pacing Amplitude: 2 V
Lead Channel Setting Pacing Pulse Width: 0.4 ms
Lead Channel Setting Sensing Sensitivity: 4 mV
Zone Setting Status: 755011
Zone Setting Status: 755011

## 2023-11-14 NOTE — Telephone Encounter (Addendum)
 Spoke with daughter, Tarri Farm:  Patient's device reached RRT on 10/07/23.  He is not dependent; however, this is an ADAPTA and he is in backup mode VVI 65.   He does not feel well at all and has been symptomatic since reaching RRT: Sx's: dizziness, SOB on exertion, increased fatigue.   ? Expediting change out due to symptoms.  Forwarding to Dr. Hester Lot and April.

## 2023-11-15 NOTE — Telephone Encounter (Signed)
 Pt has been scheduled for PPM Gen Change with Dr. Reno Cash on 7/3 at 2:30 pm.   He will come to have updated labs on 6/18.  I have completed instruction letter and went over with his Daughter on the phone and mailing a copy to her.

## 2023-11-21 ENCOUNTER — Ambulatory Visit: Payer: Self-pay | Admitting: Cardiology

## 2023-11-21 ENCOUNTER — Telehealth: Payer: Self-pay

## 2023-11-21 NOTE — Telephone Encounter (Signed)
 Spoke with patient's daughter (to complete pre-procedure call.     New medical conditions?  NO Recent hospitalizations or surgeries? NO Started any new medications? NO Patient made aware to contact office to inform of any new medications started. Any changes in activities of daily living?NO   Pre-procedure testing scheduled:and lab work on Wednesday, June 18. Confirmed patient is taking Eliquis  5mg  twice daily and will continue taking medication before procedure or it may need to be rescheduled.  Daughter advised of patient's last dose of Eliquis  is Monday, December 10, 2023  Confirmed patient is scheduled for  PPM generator change on Thursday, July 3 with Dr. Agatha Horsfall. Instructed patient to arrive at the Main Entrance A at [facility]: [address] [place], [place] [address] and check in at Admitting at 12:30 AM/PM  Advised of plan to go home the same day and will only stay overnight if medically necessary. You MUST have a responsible adult to drive you home and MUST be with you the first 24 hours after you arrive home or your procedure could be cancelled.  Patient verbalized understanding to information provided and is agreeable to proceed with procedure.

## 2023-11-29 LAB — BASIC METABOLIC PANEL WITH GFR
BUN/Creatinine Ratio: 18 (ref 10–24)
BUN: 25 mg/dL (ref 10–36)
CO2: 19 mmol/L — ABNORMAL LOW (ref 20–29)
Calcium: 10.1 mg/dL (ref 8.6–10.2)
Chloride: 103 mmol/L (ref 96–106)
Creatinine, Ser: 1.41 mg/dL — ABNORMAL HIGH (ref 0.76–1.27)
Glucose: 86 mg/dL (ref 70–99)
Potassium: 4.9 mmol/L (ref 3.5–5.2)
Sodium: 143 mmol/L (ref 134–144)
eGFR: 46 mL/min/{1.73_m2} — ABNORMAL LOW (ref >59)

## 2023-11-29 LAB — CBC
Hematocrit: 39.8 % (ref 37.5–51.0)
Hemoglobin: 12.9 g/dL — ABNORMAL LOW (ref 13.0–17.7)
MCH: 27.2 pg (ref 26.6–33.0)
MCHC: 32.4 g/dL (ref 31.5–35.7)
MCV: 84 fL (ref 79–97)
Platelets: 270 10*3/uL (ref 150–450)
RBC: 4.74 x10E6/uL (ref 4.14–5.80)
RDW: 14.2 % (ref 11.6–15.4)
WBC: 10.8 10*3/uL (ref 3.4–10.8)

## 2023-12-06 ENCOUNTER — Telehealth (HOSPITAL_COMMUNITY): Payer: Self-pay

## 2023-12-06 NOTE — Telephone Encounter (Signed)
 Attempted to reach patient to discuss upcoming procedure, no answer. Left VM for patient to return call.

## 2023-12-10 ENCOUNTER — Encounter

## 2023-12-11 ENCOUNTER — Telehealth: Payer: Self-pay

## 2023-12-11 NOTE — Telephone Encounter (Signed)
 Called pt's Daughter to inform her of time change of his PPM Gen Change with Dr. Inocencio on 7/3. She is aware to have him there at 3 pm for a 5 pm procedure.   Instructions given: NPO after 9 am the day of procedure. Pt has already been holding Eliquis . Hold Lasix  AM of procedure.

## 2023-12-12 ENCOUNTER — Encounter: Payer: Self-pay | Admitting: Emergency Medicine

## 2023-12-12 NOTE — Pre-Procedure Instructions (Signed)
 Instructed patient on the following items: Arrival time 2:30 Nothing to eat or drink after midnight No meds AM of procedure Responsible person to drive you home and stay with you for 24 hrs Wash with special soap night before and morning of procedure If on anti-coagulant drug instructions Eliquis - last dose 6/30

## 2023-12-13 ENCOUNTER — Encounter (HOSPITAL_COMMUNITY)
Admission: RE | Disposition: A | Discharge status: Home / Self Care | Payer: Self-pay | Source: Home / Self Care | Attending: Cardiology

## 2023-12-13 ENCOUNTER — Other Ambulatory Visit: Payer: Self-pay

## 2023-12-13 ENCOUNTER — Ambulatory Visit (HOSPITAL_COMMUNITY)
Admission: RE | Admit: 2023-12-13 | Discharge: 2023-12-13 | Disposition: A | Discharge status: Home / Self Care | Attending: Cardiology | Admitting: Cardiology

## 2023-12-13 DIAGNOSIS — Z45010 Encounter for checking and testing of cardiac pacemaker pulse generator [battery]: Secondary | ICD-10-CM

## 2023-12-13 DIAGNOSIS — E119 Type 2 diabetes mellitus without complications: Secondary | ICD-10-CM | POA: Insufficient documentation

## 2023-12-13 DIAGNOSIS — I4891 Unspecified atrial fibrillation: Secondary | ICD-10-CM | POA: Insufficient documentation

## 2023-12-13 DIAGNOSIS — R001 Bradycardia, unspecified: Secondary | ICD-10-CM

## 2023-12-13 DIAGNOSIS — I5022 Chronic systolic (congestive) heart failure: Secondary | ICD-10-CM | POA: Diagnosis not present

## 2023-12-13 DIAGNOSIS — I4892 Unspecified atrial flutter: Secondary | ICD-10-CM | POA: Diagnosis not present

## 2023-12-13 DIAGNOSIS — I110 Hypertensive heart disease with heart failure: Secondary | ICD-10-CM | POA: Diagnosis not present

## 2023-12-13 DIAGNOSIS — Z79899 Other long term (current) drug therapy: Secondary | ICD-10-CM | POA: Insufficient documentation

## 2023-12-13 DIAGNOSIS — Z86711 Personal history of pulmonary embolism: Secondary | ICD-10-CM | POA: Diagnosis not present

## 2023-12-13 HISTORY — PX: PPM GENERATOR CHANGEOUT: EP1233

## 2023-12-13 LAB — MRSA NEXT GEN BY PCR, NASAL: MRSA by PCR Next Gen: NOT DETECTED

## 2023-12-13 SURGERY — PPM GENERATOR CHANGEOUT

## 2023-12-13 MED ORDER — CEFAZOLIN SODIUM-DEXTROSE 2-4 GM/100ML-% IV SOLN: 2.0000 g | INTRAVENOUS | Status: AC

## 2023-12-13 MED ORDER — MUPIROCIN 2 % EX OINT
1.0000 | TOPICAL_OINTMENT | Freq: Once | CUTANEOUS | Status: AC
  Administered 2023-12-13: 1 via TOPICAL

## 2023-12-13 MED ORDER — ONDANSETRON HCL 4 MG/2ML IJ SOLN
4.0000 mg | Freq: Four times a day (QID) | INTRAMUSCULAR | Status: DC | PRN
Start: 2023-12-13 — End: 2023-12-13

## 2023-12-13 MED ORDER — CEFAZOLIN SODIUM-DEXTROSE 2-4 GM/100ML-% IV SOLN
INTRAVENOUS | Status: AC
  Administered 2023-12-13: 2 g via INTRAVENOUS
  Filled 2023-12-13: qty 100

## 2023-12-13 MED ORDER — LIDOCAINE HCL (PF) 1 % IJ SOLN
INTRAMUSCULAR | Status: DC | PRN
  Administered 2023-12-13: 40 mL

## 2023-12-13 MED ORDER — SODIUM CHLORIDE 0.9 % IV SOLN
INTRAVENOUS | Status: AC
  Administered 2023-12-13: 80 mg
  Filled 2023-12-13: qty 2

## 2023-12-13 MED ORDER — LIDOCAINE HCL (PF) 1 % IJ SOLN
INTRAMUSCULAR | Status: AC
  Filled 2023-12-13: qty 60

## 2023-12-13 MED ORDER — SODIUM CHLORIDE 0.9 % IV SOLN: 80.0000 mg | INTRAVENOUS | Status: AC

## 2023-12-13 MED ORDER — POVIDONE-IODINE 10 % EX SWAB: 2.0000 | Freq: Once | CUTANEOUS | Status: DC

## 2023-12-13 MED ORDER — SODIUM CHLORIDE 0.9 % IV SOLN
INTRAVENOUS | Status: DC
Start: 2023-12-13 — End: 2023-12-13

## 2023-12-13 MED ORDER — ACETAMINOPHEN 325 MG PO TABS: 325.0000 mg | ORAL_TABLET | ORAL | Status: DC | PRN

## 2023-12-13 MED ORDER — CHLORHEXIDINE GLUCONATE 4 % EX SOLN
4.0000 | Freq: Once | CUTANEOUS | Status: DC
  Filled 2023-12-13: qty 60

## 2023-12-13 SURGICAL SUPPLY — 5 items
CABLE SURGICAL S-101-97-12 (CABLE) ×1 IMPLANT
IPG PACE AZUR XT DR MRI W1DR01 (Pacemaker) IMPLANT
PAD DEFIB RADIO PHYSIO CONN (PAD) ×1 IMPLANT
POUCH AIGIS-R ANTIBACT PPM MED (Mesh General) IMPLANT
TRAY PACEMAKER INSERTION (PACKS) ×1 IMPLANT

## 2023-12-13 NOTE — Discharge Instructions (Addendum)

## 2023-12-13 NOTE — H&P (Signed)
  Electrophysiology Office Note:   Date:  12/13/2023  ID:  Gibson, Lad March 19, 1925, MRN [id number]  Primary Cardiologist: Lonni Cash, MD Primary Heart Failure: None Electrophysiologist: Jasa Dundon Gladis Norton, MD      History of Present Illness:   Randall Lawrence is a 89 y.o. male with h/o atrial fibrillation/flutter, chronic systolic heart failure, diabetes, hypertension, PE, symptomatic bradycardia seen today for routine electrophysiology followup.   Today, denies symptoms of palpitations, chest pain, dyspnea, orthopnea, PND, lower extremity edema, claudication, dizziness, presyncope, syncope, bleeding, or neurologic sequela. The patient is tolerating medications without difficulties. Plan for pacemaker generator change today.      EP Information / Studies Reviewed:    EKG is ordered today. Personal review as below.  Atrial flutter, ventricular paced  PPM Interrogation-  reviewed in detail today,  See PACEART report.  Device History: Medtronic Dual Chamber PPM implanted for Symptomatic bradycardia  Risk Assessment/Calculations:    CHA2DS2-VASc Score = 4   This indicates a 4.8% annual risk of stroke. The patient's score is based upon: CHF History: 1 HTN History: 1 Diabetes History: 0 Stroke History: 0 Vascular Disease History: 0 Age Score: 2 Gender Score: 0            Physical Exam:   VS:  There were no vitals taken for this visit.   Wt Readings from Last 3 Encounters:  09/17/23 68.5 kg  09/05/22 65.8 kg  08/18/21 66.1 kg    GEN: No acute distress.   Neck: No JVD Cardiac: RRR, no murmurs, rubs, or gallops.  Respiratory: normal BS bilaterally. GI: Soft, nontender, non-distended  MS: No edema; No deformity. Neuro:  Nonfocal  Skin: warm and dry Psych: Normal affect    ASSESSMENT AND PLAN:    Symptomatic bradycardia s/p Medtronic PPM  NIKOLAI WILCZAK has presented today for surgery, with the diagnosis of pacemaker ERI.  The various methods of  treatment have been discussed with the patient and family. After consideration of risks, benefits and other options for treatment, the patient has consented to  Procedure(s): Pacemaker generator change as a surgical intervention .  Risks include but not limited to bleeding, infection, pneumothorax, perforation, tamponade, vascular damage, renal failure, MI, stroke, death, and lead dislodgement . The patient's history has been reviewed, patient examined, no change in status, stable for surgery.  I have reviewed the patient's chart and labs.  Questions were answered to the patient's satisfaction.    [employer] Norton, MD 12/13/2023 2:53 PM

## 2023-12-15 ENCOUNTER — Encounter (HOSPITAL_COMMUNITY): Payer: Self-pay | Admitting: Cardiology

## 2023-12-26 NOTE — Patient Instructions (Signed)

## 2023-12-27 ENCOUNTER — Ambulatory Visit: Payer: Self-pay | Admitting: Cardiology

## 2023-12-27 ENCOUNTER — Ambulatory Visit: Attending: Cardiology

## 2023-12-27 DIAGNOSIS — I495 Sick sinus syndrome: Secondary | ICD-10-CM | POA: Insufficient documentation

## 2023-12-27 LAB — CUP PACEART INCLINIC DEVICE CHECK
Date Time Interrogation Session: 20250717132506
Implantable Lead Connection Status: 753985
Implantable Lead Connection Status: 753985
Implantable Lead Implant Date: 20100917
Implantable Lead Implant Date: 20100917
Implantable Lead Location: 753859
Implantable Lead Location: 753860
Implantable Lead Model: 4076
Implantable Lead Model: 4076
Implantable Pulse Generator Implant Date: 20250703
Lead Channel Pacing Threshold Amplitude: 0.5 V
Lead Channel Pacing Threshold Pulse Width: 0.5 ms

## 2023-12-27 NOTE — Progress Notes (Signed)
 Normal dual chamber pacemaker wound check- programmed VVIR. Presenting rhythm: AF/ VS- 66  . Wound well healed. Routine testing performed. Thresholds, sensing, and impedances consistent with pre-generator change measurements . No episodes.  Pt enrolled in remote follow-up.

## 2023-12-28 NOTE — Progress Notes (Signed)
 Remote pacemaker transmission.

## 2023-12-28 NOTE — Addendum Note (Signed)
 Addended by: VICCI SELLER A on: 12/28/2023 01:36 PM   Modules accepted: Orders

## 2024-01-07 ENCOUNTER — Encounter

## 2024-01-09 ENCOUNTER — Telehealth: Payer: Self-pay

## 2024-01-09 NOTE — Telephone Encounter (Signed)
 The pt called stating he is having some swelling on the left side. He also states that he is having some swelling on his legs. He had questions about his (water pill).

## 2024-01-09 NOTE — Telephone Encounter (Addendum)
 Discussed with patient.  He actually denies any signs of soft tissue swelling or open areas, just notices that the skin is pulled tight and he can see the outline of his device.  No pain, redness or drainage at site.  This is likely normal as patient is very skinny and thin skinned. Provided reassurances that this sounds normal, will attempt to get a picture from daughter through my chart.    Reports increase SOB on exertion and swelling to lower extremities, states he has been off of his fluid pill for months and wants to get Dr. Inocencio to send in a new prescription.   ER precautions have been given if breathing/symptoms get worse in interim.    Spoke with daughter Berwyn to and also made her aware of all above.  Will call patient and let him know what next steps are after hearing from Dr. Inocencio.   [facility] is patient's pharmacy to use.

## 2024-01-09 NOTE — Telephone Encounter (Signed)
 Routing to Dr. Inocencio

## 2024-01-10 MED ORDER — FUROSEMIDE 20 MG PO TABS: 20.0000 mg | ORAL_TABLET | Freq: Every day | ORAL | 3 refills | Status: AC

## 2024-01-10 NOTE — Telephone Encounter (Signed)
 Spoke with daughter.  Advised lasix  sent as requested.

## 2024-01-10 NOTE — Telephone Encounter (Signed)
 Refill for lasix  sent as requested.  Await further needs.

## 2024-03-10 ENCOUNTER — Encounter

## 2024-03-17 ENCOUNTER — Ambulatory Visit

## 2024-03-17 DIAGNOSIS — I4821 Permanent atrial fibrillation: Secondary | ICD-10-CM | POA: Diagnosis not present

## 2024-03-19 LAB — CUP PACEART REMOTE DEVICE CHECK
Battery Remaining Longevity: 150 mo
Battery Voltage: 3.21 V
Brady Statistic AP VP Percent: 0 %
Brady Statistic AP VS Percent: 0 %
Brady Statistic AS VP Percent: 97.97 %
Brady Statistic AS VS Percent: 2.03 %
Brady Statistic RA Percent Paced: 0 %
Brady Statistic RV Percent Paced: 97.97 %
Date Time Interrogation Session: 20251005234412
Implantable Lead Connection Status: 753985
Implantable Lead Connection Status: 753985
Implantable Lead Implant Date: 20100917
Implantable Lead Implant Date: 20100917
Implantable Lead Location: 753859
Implantable Lead Location: 753860
Implantable Lead Model: 4076
Implantable Lead Model: 4076
Implantable Pulse Generator Implant Date: 20250703
Lead Channel Impedance Value: 228 Ohm
Lead Channel Impedance Value: 361 Ohm
Lead Channel Impedance Value: 361 Ohm
Lead Channel Impedance Value: 418 Ohm
Lead Channel Pacing Threshold Amplitude: 0.375 V
Lead Channel Pacing Threshold Pulse Width: 0.4 ms
Lead Channel Sensing Intrinsic Amplitude: 1 mV
Lead Channel Sensing Intrinsic Amplitude: 13.5 mV
Lead Channel Sensing Intrinsic Amplitude: 13.5 mV
Lead Channel Setting Pacing Amplitude: 2 V
Lead Channel Setting Pacing Pulse Width: 0.4 ms
Lead Channel Setting Sensing Sensitivity: 0.9 mV
Zone Setting Status: 755011

## 2024-03-20 ENCOUNTER — Encounter: Payer: Self-pay | Admitting: Cardiology

## 2024-03-20 ENCOUNTER — Ambulatory Visit: Attending: Cardiology | Admitting: Cardiology

## 2024-03-20 VITALS — BP 108/73 | HR 83 | Ht 69.5 in | Wt 151.3 lb

## 2024-03-20 DIAGNOSIS — I495 Sick sinus syndrome: Secondary | ICD-10-CM | POA: Insufficient documentation

## 2024-03-20 DIAGNOSIS — I493 Ventricular premature depolarization: Secondary | ICD-10-CM | POA: Diagnosis present

## 2024-03-20 DIAGNOSIS — R001 Bradycardia, unspecified: Secondary | ICD-10-CM | POA: Insufficient documentation

## 2024-03-20 DIAGNOSIS — D6869 Other thrombophilia: Secondary | ICD-10-CM | POA: Insufficient documentation

## 2024-03-20 DIAGNOSIS — I4821 Permanent atrial fibrillation: Secondary | ICD-10-CM | POA: Diagnosis not present

## 2024-03-20 DIAGNOSIS — I420 Dilated cardiomyopathy: Secondary | ICD-10-CM | POA: Diagnosis not present

## 2024-03-20 LAB — CUP PACEART INCLINIC DEVICE CHECK
Date Time Interrogation Session: 20251009161328
Implantable Lead Connection Status: 753985
Implantable Lead Connection Status: 753985
Implantable Lead Implant Date: 20100917
Implantable Lead Implant Date: 20100917
Implantable Lead Location: 753859
Implantable Lead Location: 753860
Implantable Lead Model: 4076
Implantable Lead Model: 4076
Implantable Pulse Generator Implant Date: 20250703
Lead Channel Impedance Value: 418 Ohm
Lead Channel Impedance Value: 437 Ohm
Lead Channel Pacing Threshold Amplitude: 0.5 V
Lead Channel Pacing Threshold Pulse Width: 0.4 ms
Lead Channel Sensing Intrinsic Amplitude: 9.3 mV

## 2024-03-20 NOTE — Progress Notes (Signed)
 Remote PPM Transmission

## 2024-03-20 NOTE — Progress Notes (Signed)
  Electrophysiology Office Note:   Date:  03/20/2024  ID:  Randall Lawrence, DOB 08-12-1924, MRN [id number]  Primary Cardiologist: Randall Cash, MD Primary Heart Failure: None Electrophysiologist: Randall Reder Gladis Norton, MD      History of Present Illness:   Randall Lawrence is a 89 y.o. male with h/o atrial fibrillation/flutter, chronic systolic heart failure, diabetes, hypertension, PE, symptomatic bradycardia seen today for routine electrophysiology followup.   Since last being seen in our clinic the patient reports doing well.  He complains of some mild dizziness when he stands up, but otherwise has no complaint.  He has no issues since his pacemaker generator change..  he denies chest pain, palpitations, dyspnea, PND, orthopnea, nausea, vomiting, dizziness, syncope, edema, weight gain, or early satiety.   Review of systems complete and found to be negative unless listed in HPI.      EP Information / Studies Reviewed:    EKG is ordered today. Personal review as below.  EKG Interpretation Date/Time:  Thursday March 20 2024 15:35:58 EDT Ventricular Rate:  83 PR Interval:    QRS Duration:  148 QT Interval:  408 QTC Calculation: 479 R Axis:   -88  Text Interpretation: Atrial flutter Ventricular-paced rhythm When compared with ECG of 17-Sep-2023 13:59, No significant change since last tracing Confirmed by Randall Lawrence ([id number]) on 03/20/2024 4:11:53 PM   PPM Interrogation-  reviewed in detail today,  See PACEART report.  Device History: Medtronic Dual Chamber PPM implanted for Symptomatic bradycardia  Risk Assessment/Calculations:    CHA2DS2-VASc Score = 4   This indicates a 4.8% annual risk of stroke. The patient's score is based upon: CHF History: 1 HTN History: 1 Diabetes History: 0 Stroke History: 0 Vascular Disease History: 0 Age Score: 2 Gender Score: 0            Physical Exam:   VS:  BP 108/73 (BP Location: Right Arm, Patient Position: Sitting, Cuff Size:  Normal)   Pulse 83   Ht 5' 9.5 (1.765 m)   Wt 151 lb 4.8 oz (68.6 kg)   SpO2 96%   BMI 22.02 kg/m    Wt Readings from Last 3 Encounters:  03/20/24 151 lb 4.8 oz (68.6 kg)  12/13/23 149 lb 14.6 oz (68 kg)  09/17/23 151 lb 0.6 oz (68.5 kg)     GEN: Well nourished, well developed in no acute distress NECK: No JVD; No carotid bruits CARDIAC: Regular rate and rhythm, no murmurs, rubs, gallops RESPIRATORY:  Clear to auscultation without rales, wheezing or rhonchi  ABDOMEN: Soft, non-tender, non-distended EXTREMITIES:  No edema; No deformity   ASSESSMENT AND PLAN:    Symptomatic bradycardia s/p Medtronic PPM  Normal PPM function See Pace Art report No changes today  2.  Hypertension: Well-controlled  3.  PVCs: 30% burden.  On amiodarone .  4.  Permanent atrial fibrillation: Well rate controlled.  5.  Secondary hypercoagulable state: On warfarin  6.  High-risk medication monitoring: On amiodarone .  Randall Lawrence check TSH and LFTs today.  Disposition:   Follow up with EP APP in 12 months  Signed, Randall Lawrence Gladis Norton, MD

## 2024-03-21 ENCOUNTER — Ambulatory Visit: Payer: Self-pay | Admitting: Cardiology

## 2024-04-10 ENCOUNTER — Encounter

## 2024-06-16 ENCOUNTER — Ambulatory Visit

## 2024-06-16 DIAGNOSIS — I4821 Permanent atrial fibrillation: Secondary | ICD-10-CM | POA: Diagnosis not present

## 2024-06-18 ENCOUNTER — Ambulatory Visit: Payer: Self-pay | Admitting: Cardiology

## 2024-06-18 LAB — CUP PACEART REMOTE DEVICE CHECK
Battery Remaining Longevity: 145 mo
Battery Voltage: 3.18 V
Brady Statistic AP VP Percent: 0 %
Brady Statistic AP VS Percent: 0 %
Brady Statistic AS VP Percent: 98.68 %
Brady Statistic AS VS Percent: 1.32 %
Brady Statistic RA Percent Paced: 0 %
Brady Statistic RV Percent Paced: 98.68 %
Date Time Interrogation Session: 20260105043721
Implantable Lead Connection Status: 753985
Implantable Lead Connection Status: 753985
Implantable Lead Implant Date: 20100917
Implantable Lead Implant Date: 20100917
Implantable Lead Location: 753859
Implantable Lead Location: 753860
Implantable Lead Model: 4076
Implantable Lead Model: 4076
Implantable Pulse Generator Implant Date: 20250703
Lead Channel Impedance Value: 285 Ohm
Lead Channel Impedance Value: 342 Ohm
Lead Channel Impedance Value: 399 Ohm
Lead Channel Impedance Value: 418 Ohm
Lead Channel Pacing Threshold Amplitude: 0.375 V
Lead Channel Pacing Threshold Pulse Width: 0.4 ms
Lead Channel Sensing Intrinsic Amplitude: 1 mV
Lead Channel Sensing Intrinsic Amplitude: 9.875 mV
Lead Channel Sensing Intrinsic Amplitude: 9.875 mV
Lead Channel Setting Pacing Amplitude: 2 V
Lead Channel Setting Pacing Pulse Width: 0.4 ms
Lead Channel Setting Sensing Sensitivity: 0.9 mV
Zone Setting Status: 755011

## 2024-06-19 NOTE — Progress Notes (Signed)
 Remote PPM Transmission

## 2024-09-15 ENCOUNTER — Encounter

## 2024-12-15 ENCOUNTER — Encounter

## 2025-03-16 ENCOUNTER — Encounter

## 2025-06-15 ENCOUNTER — Encounter

## 2025-09-14 ENCOUNTER — Encounter

## 2025-12-15 ENCOUNTER — Encounter
